# Patient Record
Sex: Male | Born: 1965 | Race: White | Hispanic: No | Marital: Married | Smoking: Current every day smoker
Health system: Southern US, Community
[De-identification: ages and names within clinical notes are randomized; demographics above are authoritative.]

## PROBLEM LIST (undated history)

## (undated) DIAGNOSIS — K859 Acute pancreatitis without necrosis or infection, unspecified: Secondary | ICD-10-CM

## (undated) DIAGNOSIS — Z87442 Personal history of urinary calculi: Secondary | ICD-10-CM

## (undated) DIAGNOSIS — E785 Hyperlipidemia, unspecified: Secondary | ICD-10-CM

## (undated) DIAGNOSIS — E119 Type 2 diabetes mellitus without complications: Secondary | ICD-10-CM

## (undated) DIAGNOSIS — I10 Essential (primary) hypertension: Secondary | ICD-10-CM

## (undated) HISTORY — DX: Type 2 diabetes mellitus without complications: E11.9

## (undated) HISTORY — DX: Hyperlipidemia, unspecified: E78.5

## (undated) HISTORY — PX: GRAFT APPLICATION: SHX6696

## (undated) HISTORY — PX: ROTATOR CUFF REPAIR: SHX139

## (undated) HISTORY — PX: GANGLION CYST EXCISION: SHX1691

---

## 2002-08-19 ENCOUNTER — Emergency Department (HOSPITAL_COMMUNITY): Admission: EM | Admit: 2002-08-19 | Discharge: 2002-08-19 | Payer: Self-pay | Admitting: Emergency Medicine

## 2008-10-01 ENCOUNTER — Encounter: Admission: RE | Admit: 2008-10-01 | Discharge: 2008-10-01 | Payer: Self-pay | Admitting: Family Medicine

## 2008-12-24 ENCOUNTER — Encounter: Admission: RE | Admit: 2008-12-24 | Discharge: 2008-12-24 | Payer: Self-pay | Admitting: Family Medicine

## 2009-02-25 ENCOUNTER — Encounter: Admission: RE | Admit: 2009-02-25 | Discharge: 2009-02-25 | Payer: Self-pay | Admitting: Specialist

## 2010-11-05 ENCOUNTER — Other Ambulatory Visit: Payer: Self-pay | Admitting: Family Medicine

## 2010-11-05 ENCOUNTER — Ambulatory Visit
Admission: RE | Admit: 2010-11-05 | Discharge: 2010-11-05 | Disposition: A | Discharge status: Home / Self Care | Payer: Self-pay | Source: Ambulatory Visit | Attending: Family Medicine | Admitting: Family Medicine

## 2012-08-09 ENCOUNTER — Other Ambulatory Visit: Payer: Self-pay | Admitting: Family Medicine

## 2012-08-09 ENCOUNTER — Ambulatory Visit
Admission: RE | Admit: 2012-08-09 | Discharge: 2012-08-09 | Disposition: A | Discharge status: Home / Self Care | Payer: BC Managed Care – PPO | Source: Ambulatory Visit | Attending: Family Medicine | Admitting: Family Medicine

## 2012-08-09 DIAGNOSIS — M79609 Pain in unspecified limb: Secondary | ICD-10-CM

## 2013-07-17 ENCOUNTER — Encounter: Payer: BC Managed Care – PPO | Attending: Family Medicine | Admitting: Dietician

## 2013-07-17 ENCOUNTER — Encounter: Payer: Self-pay | Admitting: Dietician

## 2013-07-17 VITALS — Ht 70.0 in | Wt 182.3 lb

## 2013-07-17 DIAGNOSIS — Z713 Dietary counseling and surveillance: Secondary | ICD-10-CM | POA: Insufficient documentation

## 2013-07-17 DIAGNOSIS — E119 Type 2 diabetes mellitus without complications: Secondary | ICD-10-CM | POA: Insufficient documentation

## 2013-07-17 NOTE — Progress Notes (Signed)
Patient was seen on 07/17/13 for the first of a series of three diabetes self-management courses at the [facility].  Current HbA1c: 5.5% on 10/23  The following learning objectives were met by the patient during this class:  Describe diabetes  State some common risk factors for diabetes  Defines the role of glucose and insulin  Identifies type of diabetes and pathophysiology  Describe the relationship between diabetes and cardiovascular risk  State the members of the Healthcare Team  States the rationale for glucose monitoring  State when to test glucose  State their individual Target Range  State the importance of logging glucose readings  Describe how to interpret glucose readings  Identifies A1C target  Explain the correlation between A1c and eAG values  State symptoms and treatment of high blood glucose  State symptoms and treatment of low blood glucose  Explain proper technique for glucose testing  Identifies proper sharps disposal  Handouts given during class include:  Living Well with Diabetes book  Carb Counting and Meal Planning book  Meal Plan Card  Carbohydrate guide  Meal planning worksheet  Low Sodium Flavoring Tips  The diabetes portion plate  Low Carbohydrate Snack Suggestions  A1c to eAG Conversion Chart  Diabetes Medications  Stress Management  Diabetes Recommended Care Schedule  Diabetes Success Plan  Core Class Satisfaction Survey  Your patient has identified their diabetes care support plan as:  [facility]  Staff  Follow-Up Plan:  Attend core 2

## 2013-07-17 NOTE — Patient Instructions (Signed)
Goals:  Monitor glucose levels as instructed by your doctor 

## 2013-07-24 ENCOUNTER — Ambulatory Visit: Payer: BC Managed Care – PPO

## 2013-07-31 ENCOUNTER — Ambulatory Visit: Payer: BC Managed Care – PPO

## 2013-08-13 ENCOUNTER — Ambulatory Visit: Payer: BC Managed Care – PPO

## 2013-08-20 ENCOUNTER — Ambulatory Visit: Payer: BC Managed Care – PPO

## 2017-05-12 DIAGNOSIS — Z23 Encounter for immunization: Secondary | ICD-10-CM | POA: Diagnosis not present

## 2017-05-12 DIAGNOSIS — Z0000 Encounter for general adult medical examination without abnormal findings: Secondary | ICD-10-CM | POA: Diagnosis not present

## 2017-05-12 DIAGNOSIS — Z125 Encounter for screening for malignant neoplasm of prostate: Secondary | ICD-10-CM | POA: Diagnosis not present

## 2017-05-12 DIAGNOSIS — R739 Hyperglycemia, unspecified: Secondary | ICD-10-CM | POA: Diagnosis not present

## 2017-05-29 DIAGNOSIS — Z1211 Encounter for screening for malignant neoplasm of colon: Secondary | ICD-10-CM | POA: Diagnosis not present

## 2017-11-20 DIAGNOSIS — M26602 Left temporomandibular joint disorder, unspecified: Secondary | ICD-10-CM | POA: Diagnosis not present

## 2017-11-29 DIAGNOSIS — M26609 Unspecified temporomandibular joint disorder, unspecified side: Secondary | ICD-10-CM | POA: Diagnosis not present

## 2017-12-29 DIAGNOSIS — M25511 Pain in right shoulder: Secondary | ICD-10-CM | POA: Diagnosis not present

## 2018-01-03 DIAGNOSIS — M25511 Pain in right shoulder: Secondary | ICD-10-CM | POA: Diagnosis not present

## 2018-01-24 DIAGNOSIS — M25511 Pain in right shoulder: Secondary | ICD-10-CM | POA: Diagnosis not present

## 2018-02-06 DIAGNOSIS — M25511 Pain in right shoulder: Secondary | ICD-10-CM | POA: Diagnosis not present

## 2018-02-09 DIAGNOSIS — M25511 Pain in right shoulder: Secondary | ICD-10-CM | POA: Diagnosis not present

## 2018-03-01 DIAGNOSIS — M75121 Complete rotator cuff tear or rupture of right shoulder, not specified as traumatic: Secondary | ICD-10-CM | POA: Diagnosis not present

## 2018-03-01 DIAGNOSIS — G8918 Other acute postprocedural pain: Secondary | ICD-10-CM | POA: Diagnosis not present

## 2018-03-01 DIAGNOSIS — M7541 Impingement syndrome of right shoulder: Secondary | ICD-10-CM | POA: Diagnosis not present

## 2018-03-01 DIAGNOSIS — M898X1 Other specified disorders of bone, shoulder: Secondary | ICD-10-CM | POA: Diagnosis not present

## 2018-03-01 DIAGNOSIS — M66821 Spontaneous rupture of other tendons, right upper arm: Secondary | ICD-10-CM | POA: Diagnosis not present

## 2018-03-13 DIAGNOSIS — Z9889 Other specified postprocedural states: Secondary | ICD-10-CM | POA: Diagnosis not present

## 2018-04-03 DIAGNOSIS — Z9889 Other specified postprocedural states: Secondary | ICD-10-CM | POA: Diagnosis not present

## 2018-04-10 DIAGNOSIS — M25511 Pain in right shoulder: Secondary | ICD-10-CM | POA: Diagnosis not present

## 2018-04-10 DIAGNOSIS — M25611 Stiffness of right shoulder, not elsewhere classified: Secondary | ICD-10-CM | POA: Diagnosis not present

## 2018-04-10 DIAGNOSIS — M75101 Unspecified rotator cuff tear or rupture of right shoulder, not specified as traumatic: Secondary | ICD-10-CM | POA: Diagnosis not present

## 2018-04-18 DIAGNOSIS — M25611 Stiffness of right shoulder, not elsewhere classified: Secondary | ICD-10-CM | POA: Diagnosis not present

## 2018-04-18 DIAGNOSIS — M25511 Pain in right shoulder: Secondary | ICD-10-CM | POA: Diagnosis not present

## 2018-04-18 DIAGNOSIS — M75101 Unspecified rotator cuff tear or rupture of right shoulder, not specified as traumatic: Secondary | ICD-10-CM | POA: Diagnosis not present

## 2018-04-24 DIAGNOSIS — M25511 Pain in right shoulder: Secondary | ICD-10-CM | POA: Diagnosis not present

## 2018-04-24 DIAGNOSIS — M75101 Unspecified rotator cuff tear or rupture of right shoulder, not specified as traumatic: Secondary | ICD-10-CM | POA: Diagnosis not present

## 2018-04-24 DIAGNOSIS — M25611 Stiffness of right shoulder, not elsewhere classified: Secondary | ICD-10-CM | POA: Diagnosis not present

## 2018-04-26 DIAGNOSIS — M25511 Pain in right shoulder: Secondary | ICD-10-CM | POA: Diagnosis not present

## 2018-04-26 DIAGNOSIS — M25611 Stiffness of right shoulder, not elsewhere classified: Secondary | ICD-10-CM | POA: Diagnosis not present

## 2018-04-26 DIAGNOSIS — M75101 Unspecified rotator cuff tear or rupture of right shoulder, not specified as traumatic: Secondary | ICD-10-CM | POA: Diagnosis not present

## 2018-04-30 DIAGNOSIS — M25611 Stiffness of right shoulder, not elsewhere classified: Secondary | ICD-10-CM | POA: Diagnosis not present

## 2018-04-30 DIAGNOSIS — M75101 Unspecified rotator cuff tear or rupture of right shoulder, not specified as traumatic: Secondary | ICD-10-CM | POA: Diagnosis not present

## 2018-04-30 DIAGNOSIS — M25511 Pain in right shoulder: Secondary | ICD-10-CM | POA: Diagnosis not present

## 2018-05-02 DIAGNOSIS — Z9889 Other specified postprocedural states: Secondary | ICD-10-CM | POA: Diagnosis not present

## 2018-05-02 DIAGNOSIS — M25611 Stiffness of right shoulder, not elsewhere classified: Secondary | ICD-10-CM | POA: Diagnosis not present

## 2018-05-02 DIAGNOSIS — M25511 Pain in right shoulder: Secondary | ICD-10-CM | POA: Diagnosis not present

## 2018-05-02 DIAGNOSIS — M75101 Unspecified rotator cuff tear or rupture of right shoulder, not specified as traumatic: Secondary | ICD-10-CM | POA: Diagnosis not present

## 2018-05-08 DIAGNOSIS — M25511 Pain in right shoulder: Secondary | ICD-10-CM | POA: Diagnosis not present

## 2018-05-08 DIAGNOSIS — M75101 Unspecified rotator cuff tear or rupture of right shoulder, not specified as traumatic: Secondary | ICD-10-CM | POA: Diagnosis not present

## 2018-05-08 DIAGNOSIS — M25611 Stiffness of right shoulder, not elsewhere classified: Secondary | ICD-10-CM | POA: Diagnosis not present

## 2018-05-10 DIAGNOSIS — M25511 Pain in right shoulder: Secondary | ICD-10-CM | POA: Diagnosis not present

## 2018-05-10 DIAGNOSIS — M25611 Stiffness of right shoulder, not elsewhere classified: Secondary | ICD-10-CM | POA: Diagnosis not present

## 2018-05-10 DIAGNOSIS — M75101 Unspecified rotator cuff tear or rupture of right shoulder, not specified as traumatic: Secondary | ICD-10-CM | POA: Diagnosis not present

## 2018-05-15 DIAGNOSIS — M75101 Unspecified rotator cuff tear or rupture of right shoulder, not specified as traumatic: Secondary | ICD-10-CM | POA: Diagnosis not present

## 2018-05-15 DIAGNOSIS — M25511 Pain in right shoulder: Secondary | ICD-10-CM | POA: Diagnosis not present

## 2018-05-15 DIAGNOSIS — M25611 Stiffness of right shoulder, not elsewhere classified: Secondary | ICD-10-CM | POA: Diagnosis not present

## 2018-05-17 DIAGNOSIS — M75101 Unspecified rotator cuff tear or rupture of right shoulder, not specified as traumatic: Secondary | ICD-10-CM | POA: Diagnosis not present

## 2018-05-17 DIAGNOSIS — M25611 Stiffness of right shoulder, not elsewhere classified: Secondary | ICD-10-CM | POA: Diagnosis not present

## 2018-05-17 DIAGNOSIS — M25511 Pain in right shoulder: Secondary | ICD-10-CM | POA: Diagnosis not present

## 2018-05-21 DIAGNOSIS — M25611 Stiffness of right shoulder, not elsewhere classified: Secondary | ICD-10-CM | POA: Diagnosis not present

## 2018-05-21 DIAGNOSIS — M75101 Unspecified rotator cuff tear or rupture of right shoulder, not specified as traumatic: Secondary | ICD-10-CM | POA: Diagnosis not present

## 2018-05-21 DIAGNOSIS — M25511 Pain in right shoulder: Secondary | ICD-10-CM | POA: Diagnosis not present

## 2018-05-23 DIAGNOSIS — M75101 Unspecified rotator cuff tear or rupture of right shoulder, not specified as traumatic: Secondary | ICD-10-CM | POA: Diagnosis not present

## 2018-05-23 DIAGNOSIS — M25611 Stiffness of right shoulder, not elsewhere classified: Secondary | ICD-10-CM | POA: Diagnosis not present

## 2018-05-23 DIAGNOSIS — M25511 Pain in right shoulder: Secondary | ICD-10-CM | POA: Diagnosis not present

## 2018-05-28 DIAGNOSIS — M25511 Pain in right shoulder: Secondary | ICD-10-CM | POA: Diagnosis not present

## 2018-05-28 DIAGNOSIS — M75101 Unspecified rotator cuff tear or rupture of right shoulder, not specified as traumatic: Secondary | ICD-10-CM | POA: Diagnosis not present

## 2018-05-28 DIAGNOSIS — M25611 Stiffness of right shoulder, not elsewhere classified: Secondary | ICD-10-CM | POA: Diagnosis not present

## 2018-05-30 DIAGNOSIS — M75101 Unspecified rotator cuff tear or rupture of right shoulder, not specified as traumatic: Secondary | ICD-10-CM | POA: Diagnosis not present

## 2018-05-30 DIAGNOSIS — M25611 Stiffness of right shoulder, not elsewhere classified: Secondary | ICD-10-CM | POA: Diagnosis not present

## 2018-05-30 DIAGNOSIS — M25511 Pain in right shoulder: Secondary | ICD-10-CM | POA: Diagnosis not present

## 2018-06-06 DIAGNOSIS — R7303 Prediabetes: Secondary | ICD-10-CM | POA: Diagnosis not present

## 2018-06-06 DIAGNOSIS — I10 Essential (primary) hypertension: Secondary | ICD-10-CM | POA: Diagnosis not present

## 2018-06-06 DIAGNOSIS — E785 Hyperlipidemia, unspecified: Secondary | ICD-10-CM | POA: Diagnosis not present

## 2018-06-06 DIAGNOSIS — Z125 Encounter for screening for malignant neoplasm of prostate: Secondary | ICD-10-CM | POA: Diagnosis not present

## 2018-06-06 DIAGNOSIS — Z23 Encounter for immunization: Secondary | ICD-10-CM | POA: Diagnosis not present

## 2018-06-06 DIAGNOSIS — Z0000 Encounter for general adult medical examination without abnormal findings: Secondary | ICD-10-CM | POA: Diagnosis not present

## 2018-06-07 DIAGNOSIS — M25511 Pain in right shoulder: Secondary | ICD-10-CM | POA: Diagnosis not present

## 2018-06-07 DIAGNOSIS — M25611 Stiffness of right shoulder, not elsewhere classified: Secondary | ICD-10-CM | POA: Diagnosis not present

## 2018-06-07 DIAGNOSIS — M75101 Unspecified rotator cuff tear or rupture of right shoulder, not specified as traumatic: Secondary | ICD-10-CM | POA: Diagnosis not present

## 2018-06-12 DIAGNOSIS — M25611 Stiffness of right shoulder, not elsewhere classified: Secondary | ICD-10-CM | POA: Diagnosis not present

## 2018-06-12 DIAGNOSIS — M75101 Unspecified rotator cuff tear or rupture of right shoulder, not specified as traumatic: Secondary | ICD-10-CM | POA: Diagnosis not present

## 2018-06-12 DIAGNOSIS — M25511 Pain in right shoulder: Secondary | ICD-10-CM | POA: Diagnosis not present

## 2018-06-13 DIAGNOSIS — M25611 Stiffness of right shoulder, not elsewhere classified: Secondary | ICD-10-CM | POA: Diagnosis not present

## 2018-06-13 DIAGNOSIS — M75101 Unspecified rotator cuff tear or rupture of right shoulder, not specified as traumatic: Secondary | ICD-10-CM | POA: Diagnosis not present

## 2018-06-13 DIAGNOSIS — M25511 Pain in right shoulder: Secondary | ICD-10-CM | POA: Diagnosis not present

## 2018-06-19 DIAGNOSIS — M25511 Pain in right shoulder: Secondary | ICD-10-CM | POA: Diagnosis not present

## 2018-06-19 DIAGNOSIS — M75101 Unspecified rotator cuff tear or rupture of right shoulder, not specified as traumatic: Secondary | ICD-10-CM | POA: Diagnosis not present

## 2018-06-19 DIAGNOSIS — M25611 Stiffness of right shoulder, not elsewhere classified: Secondary | ICD-10-CM | POA: Diagnosis not present

## 2018-06-21 DIAGNOSIS — M25611 Stiffness of right shoulder, not elsewhere classified: Secondary | ICD-10-CM | POA: Diagnosis not present

## 2018-06-21 DIAGNOSIS — Z1211 Encounter for screening for malignant neoplasm of colon: Secondary | ICD-10-CM | POA: Diagnosis not present

## 2018-06-21 DIAGNOSIS — M25511 Pain in right shoulder: Secondary | ICD-10-CM | POA: Diagnosis not present

## 2018-06-21 DIAGNOSIS — M75101 Unspecified rotator cuff tear or rupture of right shoulder, not specified as traumatic: Secondary | ICD-10-CM | POA: Diagnosis not present

## 2018-06-25 DIAGNOSIS — M75101 Unspecified rotator cuff tear or rupture of right shoulder, not specified as traumatic: Secondary | ICD-10-CM | POA: Diagnosis not present

## 2018-06-25 DIAGNOSIS — M25511 Pain in right shoulder: Secondary | ICD-10-CM | POA: Diagnosis not present

## 2018-06-25 DIAGNOSIS — M25611 Stiffness of right shoulder, not elsewhere classified: Secondary | ICD-10-CM | POA: Diagnosis not present

## 2018-06-27 DIAGNOSIS — M25611 Stiffness of right shoulder, not elsewhere classified: Secondary | ICD-10-CM | POA: Diagnosis not present

## 2018-06-27 DIAGNOSIS — M25511 Pain in right shoulder: Secondary | ICD-10-CM | POA: Diagnosis not present

## 2018-06-27 DIAGNOSIS — M75101 Unspecified rotator cuff tear or rupture of right shoulder, not specified as traumatic: Secondary | ICD-10-CM | POA: Diagnosis not present

## 2018-07-02 DIAGNOSIS — M25611 Stiffness of right shoulder, not elsewhere classified: Secondary | ICD-10-CM | POA: Diagnosis not present

## 2018-07-02 DIAGNOSIS — M75101 Unspecified rotator cuff tear or rupture of right shoulder, not specified as traumatic: Secondary | ICD-10-CM | POA: Diagnosis not present

## 2018-07-02 DIAGNOSIS — M25511 Pain in right shoulder: Secondary | ICD-10-CM | POA: Diagnosis not present

## 2018-07-04 DIAGNOSIS — M25611 Stiffness of right shoulder, not elsewhere classified: Secondary | ICD-10-CM | POA: Diagnosis not present

## 2018-07-04 DIAGNOSIS — M25511 Pain in right shoulder: Secondary | ICD-10-CM | POA: Diagnosis not present

## 2018-07-04 DIAGNOSIS — M75101 Unspecified rotator cuff tear or rupture of right shoulder, not specified as traumatic: Secondary | ICD-10-CM | POA: Diagnosis not present

## 2018-09-14 ENCOUNTER — Other Ambulatory Visit: Payer: Self-pay | Admitting: Family Medicine

## 2018-09-14 ENCOUNTER — Ambulatory Visit
Admission: RE | Admit: 2018-09-14 | Discharge: 2018-09-14 | Disposition: A | Discharge status: Home / Self Care | Payer: BLUE CROSS/BLUE SHIELD | Source: Ambulatory Visit | Attending: Family Medicine | Admitting: Family Medicine

## 2018-09-14 DIAGNOSIS — R52 Pain, unspecified: Secondary | ICD-10-CM

## 2018-09-14 DIAGNOSIS — R109 Unspecified abdominal pain: Secondary | ICD-10-CM | POA: Diagnosis not present

## 2018-09-14 DIAGNOSIS — N202 Calculus of kidney with calculus of ureter: Secondary | ICD-10-CM | POA: Diagnosis not present

## 2018-09-14 MED ORDER — IOPAMIDOL (ISOVUE-300) INJECTION 61%
100.0000 mL | Freq: Once | INTRAVENOUS | Status: AC | PRN
  Administered 2018-09-14: 100 mL via INTRAVENOUS

## 2018-09-26 DIAGNOSIS — N201 Calculus of ureter: Secondary | ICD-10-CM | POA: Diagnosis not present

## 2018-09-26 DIAGNOSIS — M545 Low back pain: Secondary | ICD-10-CM | POA: Diagnosis not present

## 2018-09-27 ENCOUNTER — Other Ambulatory Visit: Payer: Self-pay | Admitting: Urology

## 2018-09-27 ENCOUNTER — Encounter (HOSPITAL_COMMUNITY): Payer: Self-pay | Admitting: *Deleted

## 2018-09-27 NOTE — Progress Notes (Signed)
Patient instructed to arrive 0930 to admitting on the [facility]. No aspirin or NSAIDS until after the procedure. NPO after midnight. To bring responsible driver to drive him home. Reviewed need for laxative on Sunday prior to procedure. Patient verbalizes understanding.

## 2018-10-01 ENCOUNTER — Ambulatory Visit (HOSPITAL_COMMUNITY): Payer: BLUE CROSS/BLUE SHIELD

## 2018-10-01 ENCOUNTER — Ambulatory Visit (HOSPITAL_COMMUNITY)
Admission: RE | Admit: 2018-10-01 | Discharge: 2018-10-01 | Disposition: A | Discharge status: Home / Self Care | Payer: BLUE CROSS/BLUE SHIELD | Attending: Urology | Admitting: Urology

## 2018-10-01 ENCOUNTER — Other Ambulatory Visit: Payer: Self-pay

## 2018-10-01 ENCOUNTER — Encounter (HOSPITAL_COMMUNITY): Payer: Self-pay | Admitting: *Deleted

## 2018-10-01 ENCOUNTER — Encounter (HOSPITAL_COMMUNITY)
Admission: RE | Disposition: A | Discharge status: Home / Self Care | Payer: Self-pay | Source: Home / Self Care | Attending: Urology

## 2018-10-01 ENCOUNTER — Other Ambulatory Visit: Payer: Self-pay | Admitting: Urology

## 2018-10-01 DIAGNOSIS — E119 Type 2 diabetes mellitus without complications: Secondary | ICD-10-CM | POA: Insufficient documentation

## 2018-10-01 DIAGNOSIS — F17210 Nicotine dependence, cigarettes, uncomplicated: Secondary | ICD-10-CM | POA: Diagnosis not present

## 2018-10-01 DIAGNOSIS — N201 Calculus of ureter: Secondary | ICD-10-CM | POA: Diagnosis not present

## 2018-10-01 DIAGNOSIS — N200 Calculus of kidney: Secondary | ICD-10-CM | POA: Diagnosis not present

## 2018-10-01 DIAGNOSIS — Z87442 Personal history of urinary calculi: Secondary | ICD-10-CM | POA: Insufficient documentation

## 2018-10-01 DIAGNOSIS — Z79899 Other long term (current) drug therapy: Secondary | ICD-10-CM | POA: Insufficient documentation

## 2018-10-01 HISTORY — DX: Personal history of urinary calculi: Z87.442

## 2018-10-01 HISTORY — PX: EXTRACORPOREAL SHOCK WAVE LITHOTRIPSY: SHX1557

## 2018-10-01 LAB — GLUCOSE, CAPILLARY: GLUCOSE-CAPILLARY: 156 mg/dL — AB (ref 70–99)

## 2018-10-01 SURGERY — LITHOTRIPSY, ESWL
Anesthesia: LOCAL | Laterality: Left

## 2018-10-01 MED ORDER — DIAZEPAM 5 MG PO TABS
10.0000 mg | ORAL_TABLET | ORAL | Status: AC
  Administered 2018-10-01: 10 mg via ORAL
  Filled 2018-10-01: qty 2

## 2018-10-01 MED ORDER — OXYCODONE HCL 5 MG PO TABS: 5.0000 mg | ORAL_TABLET | ORAL | Status: DC | PRN

## 2018-10-01 MED ORDER — CIPROFLOXACIN HCL 500 MG PO TABS
500.0000 mg | ORAL_TABLET | ORAL | Status: AC
  Administered 2018-10-01: 500 mg via ORAL
  Filled 2018-10-01: qty 1

## 2018-10-01 MED ORDER — DIPHENHYDRAMINE HCL 25 MG PO CAPS
25.0000 mg | ORAL_CAPSULE | ORAL | Status: AC
  Administered 2018-10-01: 25 mg via ORAL
  Filled 2018-10-01: qty 1

## 2018-10-01 MED ORDER — SODIUM CHLORIDE 0.9 % IV SOLN
INTRAVENOUS | Status: DC
  Administered 2018-10-01: 10:00:00 via INTRAVENOUS

## 2018-10-01 NOTE — H&P (Signed)
I have ureteral stone.  HPI: Charles Wolf is a 53 year-old male patient who is here for ureteral stone.  The problem is on the left side. He first stated noticing pain on 09/14/2018. This is not his first kidney stone. He is currently having back pain. He denies having flank pain, groin pain, nausea, vomiting, fever, and chills. Pain is occuring on the left side. He has not caught a stone in his urine strainer since his symptoms began.   He has never had surgical treatment for calculi in the past.   He underwent a CT scan of the abdomen and pelvis September 14, 2018 which revealed about an 8 mm stone in the left ureterovesical junction. It was visible on the scout image.   He's staying hydrated. No constipation. Pain in front of abd and low back. KUB today confirms an 8 mm left UPJ stone. He hasn't worked since Monday.     ALLERGIES: None   MEDICATIONS: Tamsulosin Hcl 0.4 mg capsule  Ambien  Hydrocodone-Acetaminophen  Zofran     GU PSH: None     PSH Notes: bone graft rt wrist x 2, ganglion cyst rt wrist   NON-GU PSH: Rotator Cuff Surgery.., Right    GU PMH: Renal calculus      PMH Notes:  diabetes diet controlled   NON-GU PMH: Diabetes Type 2    FAMILY HISTORY: Deceased - Father Diabetes - Mother Glaucoma - Mother hyperten - Mother   SOCIAL HISTORY: Marital Status: Married Preferred Language: English; Race: White Current Smoking Status: Patient smokes. Has smoked since 09/22/1988. Smokes 1/2 pack per day.   Tobacco Use Assessment Completed: Used Tobacco in last 30 days? Social Drinker.  Drinks 2 caffeinated drinks per day. 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     Notes: 1 daughter   REVIEW OF SYSTEMS:    GU Review Male:   Patient denies frequent urination, hard to postpone urination, burning/ pain with urination, get up at night to urinate, leakage of urine, stream starts and stops, trouble starting your stream, have to strain to urinate , erection  problems, and penile pain.  Gastrointestinal (Upper):   Patient reports nausea and indigestion/ heartburn. Patient denies vomiting.  Gastrointestinal (Lower):   Patient denies diarrhea and constipation.  Constitutional:   Patient reports night sweats, weight loss, and fatigue. Patient denies fever.  Skin:   Patient denies skin rash/ lesion and itching.  Eyes:   Patient denies double vision and blurred vision.  Ears/ Nose/ Throat:   Patient reports sinus problems. Patient denies sore throat.  Hematologic/Lymphatic:   Patient denies swollen glands and easy bruising.  Cardiovascular:   Patient denies leg swelling and chest pains.  Respiratory:   Patient denies cough and shortness of breath.  Endocrine:   Patient reports excessive thirst.   Musculoskeletal:   Patient reports back pain. Patient denies joint pain.  Neurological:   Patient denies headaches and dizziness.  Psychologic:   Patient denies depression and anxiety.   VITAL SIGNS:      09/26/2018 02:04 PM  Weight 179 lb / 81.19 kg  Height 69 in / 175.26 cm  BP 143/100 mmHg  Pulse 96 /min  Temperature 97.8 F / 36.5 C  BMI 26.4 kg/m   GU PHYSICAL EXAMINATION:    Anus and Perineum: No hemorrhoids. No anal stenosis. No rectal fissure, no anal fissure. No edema, no dimple, no perineal tenderness, no anal tenderness.  Scrotum: No lesions. No edema. No cysts. No warts.  Epididymides: Right:  no spermatocele, no masses, no cysts, no tenderness, no induration, no enlargement. Left: no spermatocele, no masses, no cysts, no tenderness, no induration, no enlargement.  Testes: No tenderness, no swelling, no enlargement left testes. No tenderness, no swelling, no enlargement right testes. Normal location left testes. Normal location right testes. No mass, no cyst, no varicocele, no hydrocele left testes. No mass, no cyst, no varicocele, no hydrocele right testes.  Urethral Meatus: Normal size. No lesion, no wart, no discharge, no polyp. Normal  location.  Penis: Circumcised, no warts, no cracks. No dorsal Peyronie's plaques, no left corporal Peyronie's plaques, no right corporal Peyronie's plaques, no scarring, no warts. No balanitis, no meatal stenosis.  Prostate: 40 gram or 2+ size. Left lobe normal consistency, right lobe normal consistency. Symmetrical lobes. No prostate nodule. Left lobe no tenderness, right lobe no tenderness.  Seminal Vesicles: Nonpalpable.  Sphincter Tone: Normal sphincter. No rectal tenderness. No rectal mass.    MULTI-SYSTEM PHYSICAL EXAMINATION:    Constitutional: Well-nourished. No physical deformities. Normally developed. Good grooming.  Neck: Neck symmetrical, not swollen. Normal tracheal position.  Respiratory: No labored breathing, no use of accessory muscles.   Cardiovascular: Normal temperature, normal extremity pulses, no swelling, no varicosities.  Lymphatic: No enlargement of neck, axillae, groin.  Skin: No paleness, no jaundice, no cyanosis. No lesion, no ulcer, no rash.  Neurologic / Psychiatric: Oriented to time, oriented to place, oriented to person. No depression, no anxiety, no agitation.  Gastrointestinal: No mass, no tenderness, no rigidity, non obese abdomen.  Eyes: Normal conjunctivae. Normal eyelids.  Ears, Nose, Mouth, and Throat: Left ear no scars, no lesions, no masses. Right ear no scars, no lesions, no masses. Nose no scars, no lesions, no masses. Normal hearing. Normal lips.  Musculoskeletal: Normal gait and station of head and neck.     PAST DATA REVIEWED:  Source Of History:  Patient   PROCEDURES:         KUB - [id number]  A single view of the abdomen is obtained.  Calculi:  8 mm left distal stone       The bones appeared normal. The bowel gas pattern appeared normal. The soft tissues were unremarkable.         Urinalysis w/Scope Dipstick Dipstick Cont'd Micro  Color: Amber Bilirubin: Neg mg/dL WBC/hpf: 0 - 5/hpf  Appearance: Clear Ketones: 2+ mg/dL RBC/hpf: 0 - 2/hpf   Specific Gravity: 1.025 Blood: Neg ery/uL Bacteria: NS (Not Seen)  pH: 6.0 Protein: 1+ mg/dL Cystals: NS (Not Seen)  Glucose: Neg mg/dL Urobilinogen: 0.2 mg/dL Casts: Hyaline    Nitrites: Neg Trichomonas: Not Present    Leukocyte Esterase: Neg leu/uL Mucous: Present      Epithelial Cells: NS (Not Seen)      Yeast: NS (Not Seen)      Sperm: Not Present    ASSESSMENT:      ICD-10 Details  1 GU:   Ureteral calculus - N20.1   2   Low back pain - M54.5    PLAN:           Orders X-Rays: KUB          Schedule Return Visit/Planned Activity: Next Available Appointment - Schedule Surgery          Document Letter(s):  Created for Patient: Clinical Summary         Notes:   left distal stone - I discussed with the patient the nature risks and benefits of continued stone passage, off label use of  alpha blockers, shockwave lithotripsy or ureteroscopy. We discussed expected rate of complications and need for staged procedures. All questions answered. He would like to proceed with shockwave lithotripsy. Also discussed one of my colleagues would likely do the case.

## 2018-10-01 NOTE — Discharge Instructions (Signed)
See [facility] discharge instructions in chart.  

## 2018-10-01 NOTE — Op Note (Signed)
See [facility] OP note scanned into chart. Also because of the size, density, location and other factors that cannot be anticipated I feel this will likely be a staged procedure. This fact supersedes any indication in the scanned [facility] operative note to the contrary.  

## 2018-10-02 ENCOUNTER — Encounter (HOSPITAL_COMMUNITY): Payer: Self-pay | Admitting: Urology

## 2018-10-15 DIAGNOSIS — N201 Calculus of ureter: Secondary | ICD-10-CM | POA: Diagnosis not present

## 2018-10-16 DIAGNOSIS — N201 Calculus of ureter: Secondary | ICD-10-CM | POA: Diagnosis not present

## 2019-07-15 DIAGNOSIS — Z0000 Encounter for general adult medical examination without abnormal findings: Secondary | ICD-10-CM | POA: Diagnosis not present

## 2019-07-24 DIAGNOSIS — R1011 Right upper quadrant pain: Secondary | ICD-10-CM | POA: Diagnosis not present

## 2019-07-25 ENCOUNTER — Ambulatory Visit
Admission: RE | Admit: 2019-07-25 | Discharge: 2019-07-25 | Disposition: A | Discharge status: Home / Self Care | Payer: BC Managed Care – PPO | Source: Ambulatory Visit | Attending: Family Medicine | Admitting: Family Medicine

## 2019-07-25 ENCOUNTER — Other Ambulatory Visit: Payer: Self-pay | Admitting: Family Medicine

## 2019-07-25 DIAGNOSIS — K760 Fatty (change of) liver, not elsewhere classified: Secondary | ICD-10-CM | POA: Diagnosis not present

## 2019-07-25 DIAGNOSIS — R1011 Right upper quadrant pain: Secondary | ICD-10-CM

## 2019-07-25 DIAGNOSIS — N200 Calculus of kidney: Secondary | ICD-10-CM | POA: Diagnosis not present

## 2019-07-25 MED ORDER — IOPAMIDOL (ISOVUE-300) INJECTION 61%
100.0000 mL | Freq: Once | INTRAVENOUS | Status: AC | PRN
  Administered 2019-07-25: 100 mL via INTRAVENOUS

## 2019-07-29 DIAGNOSIS — R1011 Right upper quadrant pain: Secondary | ICD-10-CM | POA: Diagnosis not present

## 2019-07-29 DIAGNOSIS — R1084 Generalized abdominal pain: Secondary | ICD-10-CM | POA: Diagnosis not present

## 2019-08-22 DIAGNOSIS — N202 Calculus of kidney with calculus of ureter: Secondary | ICD-10-CM | POA: Diagnosis not present

## 2019-08-27 ENCOUNTER — Other Ambulatory Visit (HOSPITAL_COMMUNITY): Payer: Self-pay | Admitting: Gastroenterology

## 2019-08-27 ENCOUNTER — Other Ambulatory Visit: Payer: Self-pay | Admitting: Gastroenterology

## 2019-08-27 DIAGNOSIS — R1013 Epigastric pain: Secondary | ICD-10-CM | POA: Diagnosis not present

## 2019-08-27 DIAGNOSIS — R634 Abnormal weight loss: Secondary | ICD-10-CM | POA: Diagnosis not present

## 2019-08-30 ENCOUNTER — Ambulatory Visit (HOSPITAL_COMMUNITY)
Admission: RE | Admit: 2019-08-30 | Discharge: 2019-08-30 | Disposition: A | Discharge status: Home / Self Care | Payer: BC Managed Care – PPO | Source: Ambulatory Visit | Attending: Gastroenterology | Admitting: Gastroenterology

## 2019-08-30 ENCOUNTER — Other Ambulatory Visit: Payer: Self-pay

## 2019-08-30 DIAGNOSIS — K760 Fatty (change of) liver, not elsewhere classified: Secondary | ICD-10-CM | POA: Diagnosis not present

## 2019-08-30 DIAGNOSIS — R1013 Epigastric pain: Secondary | ICD-10-CM | POA: Diagnosis not present

## 2019-09-03 ENCOUNTER — Other Ambulatory Visit: Payer: BC Managed Care – PPO

## 2019-09-03 DIAGNOSIS — R1013 Epigastric pain: Secondary | ICD-10-CM | POA: Diagnosis not present

## 2019-09-03 DIAGNOSIS — R634 Abnormal weight loss: Secondary | ICD-10-CM | POA: Diagnosis not present

## 2019-09-09 DIAGNOSIS — Z1159 Encounter for screening for other viral diseases: Secondary | ICD-10-CM | POA: Diagnosis not present

## 2019-09-12 DIAGNOSIS — R748 Abnormal levels of other serum enzymes: Secondary | ICD-10-CM | POA: Diagnosis not present

## 2019-09-12 DIAGNOSIS — K449 Diaphragmatic hernia without obstruction or gangrene: Secondary | ICD-10-CM | POA: Diagnosis not present

## 2019-09-12 DIAGNOSIS — R1011 Right upper quadrant pain: Secondary | ICD-10-CM | POA: Diagnosis not present

## 2019-09-12 DIAGNOSIS — R1013 Epigastric pain: Secondary | ICD-10-CM | POA: Diagnosis not present

## 2019-09-12 DIAGNOSIS — K2970 Gastritis, unspecified, without bleeding: Secondary | ICD-10-CM | POA: Diagnosis not present

## 2019-09-12 DIAGNOSIS — R932 Abnormal findings on diagnostic imaging of liver and biliary tract: Secondary | ICD-10-CM | POA: Diagnosis not present

## 2019-09-25 ENCOUNTER — Other Ambulatory Visit (HOSPITAL_COMMUNITY): Payer: Self-pay | Admitting: Gastroenterology

## 2019-09-25 ENCOUNTER — Other Ambulatory Visit: Payer: Self-pay | Admitting: Gastroenterology

## 2019-09-25 DIAGNOSIS — R7401 Elevation of levels of liver transaminase levels: Secondary | ICD-10-CM | POA: Diagnosis not present

## 2019-09-25 DIAGNOSIS — R1013 Epigastric pain: Secondary | ICD-10-CM

## 2019-09-25 DIAGNOSIS — R634 Abnormal weight loss: Secondary | ICD-10-CM | POA: Diagnosis not present

## 2019-10-07 ENCOUNTER — Other Ambulatory Visit: Payer: Self-pay

## 2019-10-07 ENCOUNTER — Ambulatory Visit (HOSPITAL_COMMUNITY)
Admission: RE | Admit: 2019-10-07 | Discharge: 2019-10-07 | Disposition: A | Discharge status: Home / Self Care | Payer: BC Managed Care – PPO | Source: Ambulatory Visit | Attending: Gastroenterology | Admitting: Gastroenterology

## 2019-10-07 DIAGNOSIS — N202 Calculus of kidney with calculus of ureter: Secondary | ICD-10-CM | POA: Diagnosis not present

## 2019-10-07 DIAGNOSIS — R1013 Epigastric pain: Secondary | ICD-10-CM | POA: Insufficient documentation

## 2019-10-07 MED ORDER — TECHNETIUM TC 99M MEBROFENIN IV KIT
5.3400 | PACK | Freq: Once | INTRAVENOUS | Status: AC | PRN
  Administered 2019-10-07: 5.34 via INTRAVENOUS

## 2019-11-04 DIAGNOSIS — R633 Feeding difficulties: Secondary | ICD-10-CM | POA: Diagnosis not present

## 2019-11-04 DIAGNOSIS — R634 Abnormal weight loss: Secondary | ICD-10-CM | POA: Diagnosis not present

## 2019-11-04 DIAGNOSIS — R7401 Elevation of levels of liver transaminase levels: Secondary | ICD-10-CM | POA: Diagnosis not present

## 2019-11-04 DIAGNOSIS — R1013 Epigastric pain: Secondary | ICD-10-CM | POA: Diagnosis not present

## 2019-12-06 DIAGNOSIS — I10 Essential (primary) hypertension: Secondary | ICD-10-CM | POA: Diagnosis not present

## 2019-12-06 DIAGNOSIS — F5101 Primary insomnia: Secondary | ICD-10-CM | POA: Diagnosis not present

## 2020-01-07 DIAGNOSIS — I10 Essential (primary) hypertension: Secondary | ICD-10-CM | POA: Diagnosis not present

## 2020-01-07 DIAGNOSIS — F5101 Primary insomnia: Secondary | ICD-10-CM | POA: Diagnosis not present

## 2020-04-17 IMAGING — US US ABDOMEN LIMITED
1 series · 14 of 25 positions shown · non-contrast
Comparison: None.

CLINICAL DATA: Right upper quadrant pain

EXAM:
ULTRASOUND ABDOMEN LIMITED RIGHT UPPER QUADRANT

[Series 1: us abdomen limited · 14 of 46 slices shown]
[im 1/46]
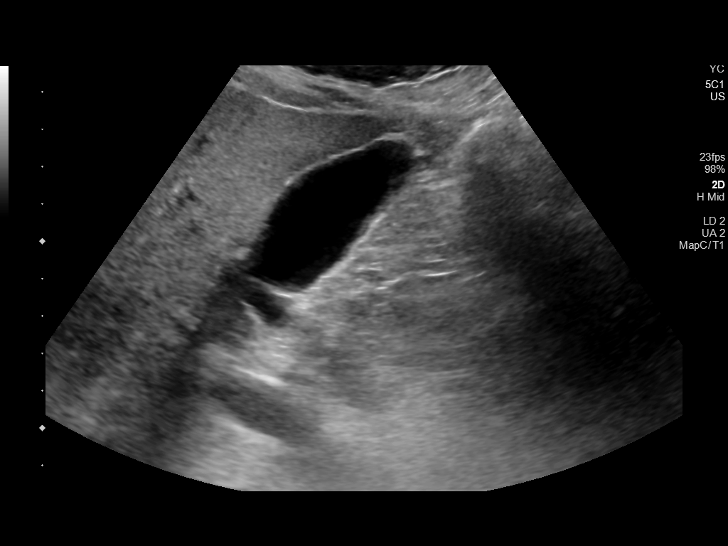
[im 4/46]
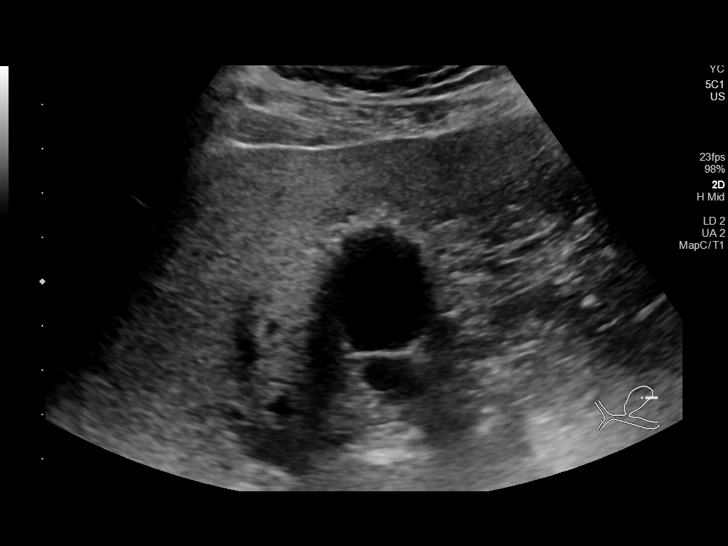
[im 8/46]
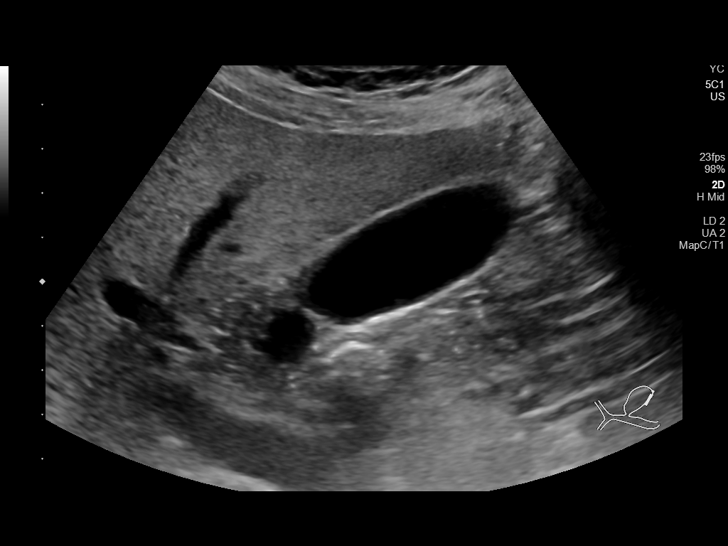
[im 12/46]
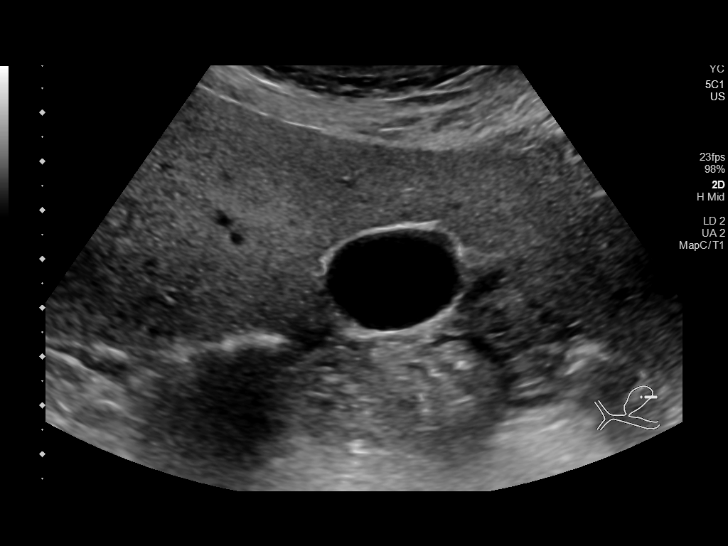
[im 16/46]
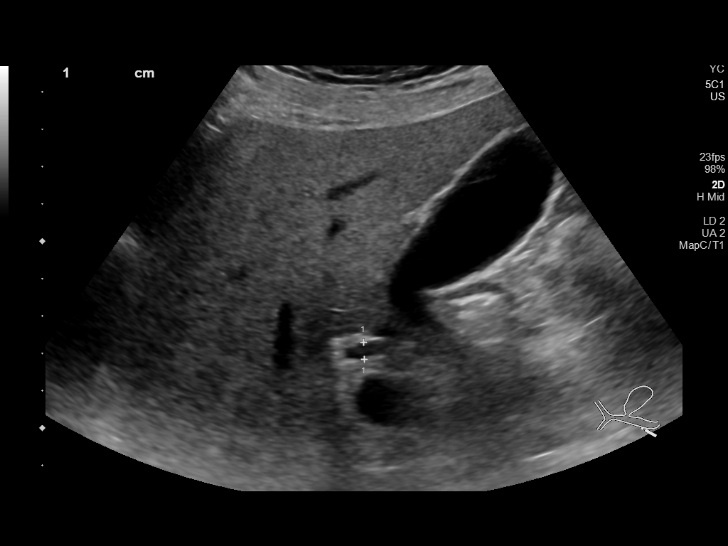
[im 17/46]
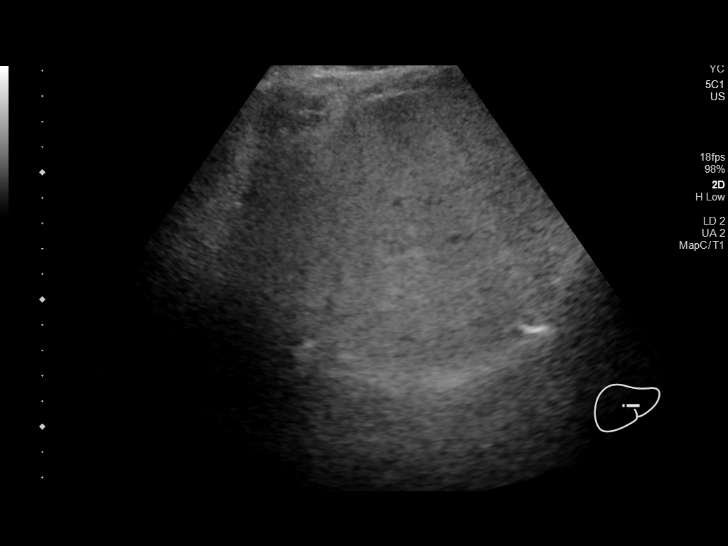
[im 21/46]
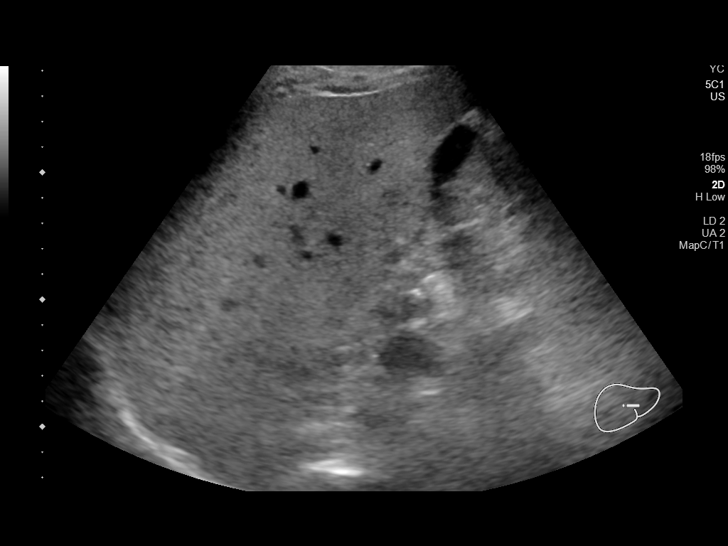
[im 25/46]
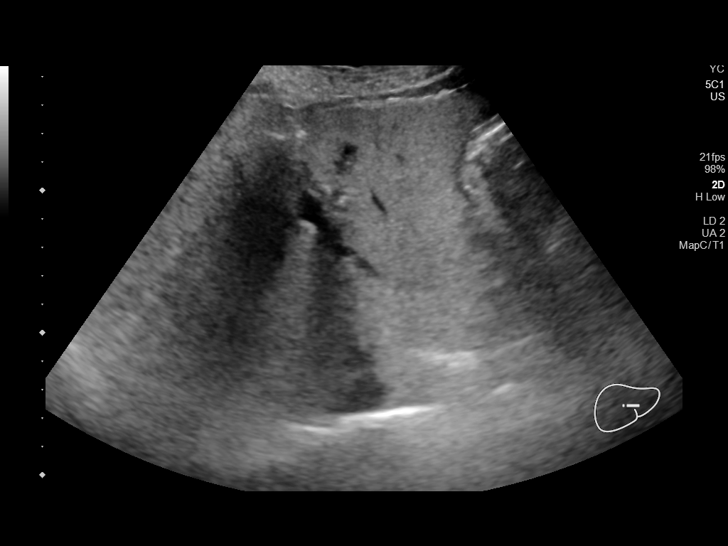
[im 29/46]
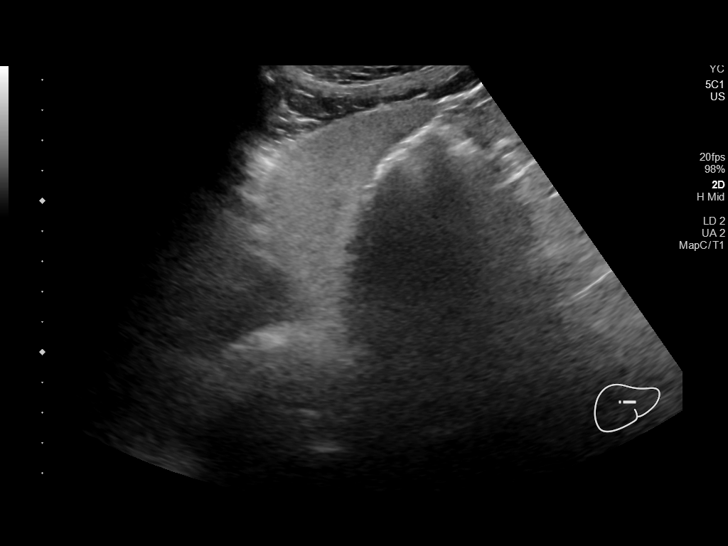
[im 31/46]
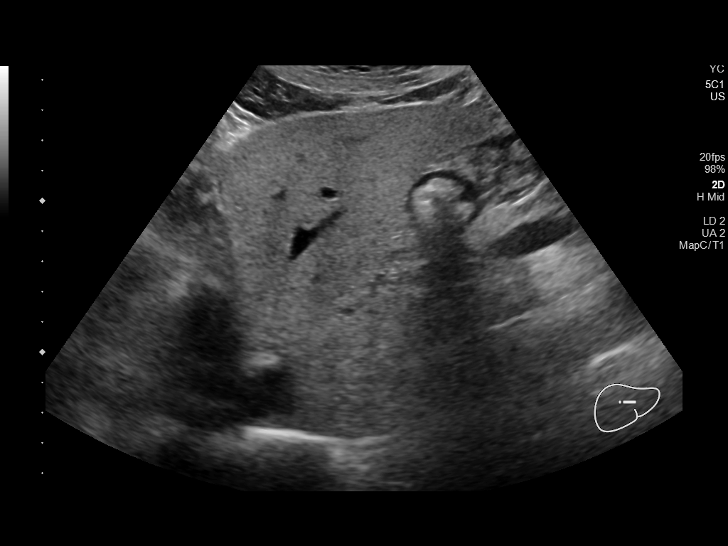
[im 34/46]
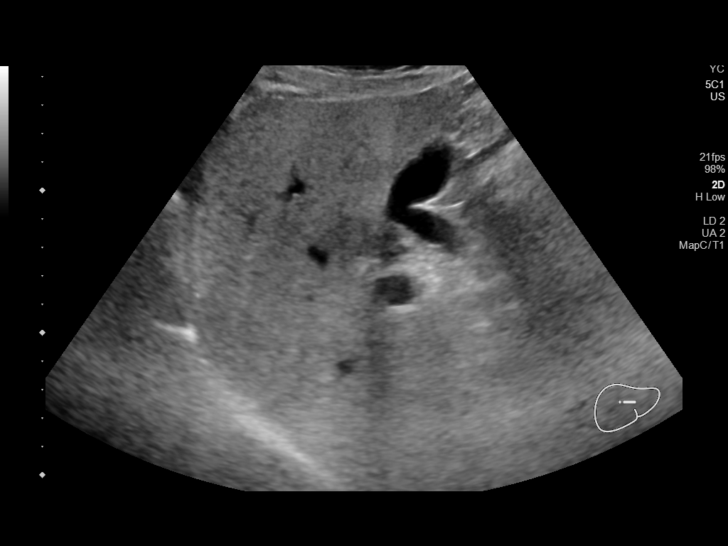
[im 38/46]
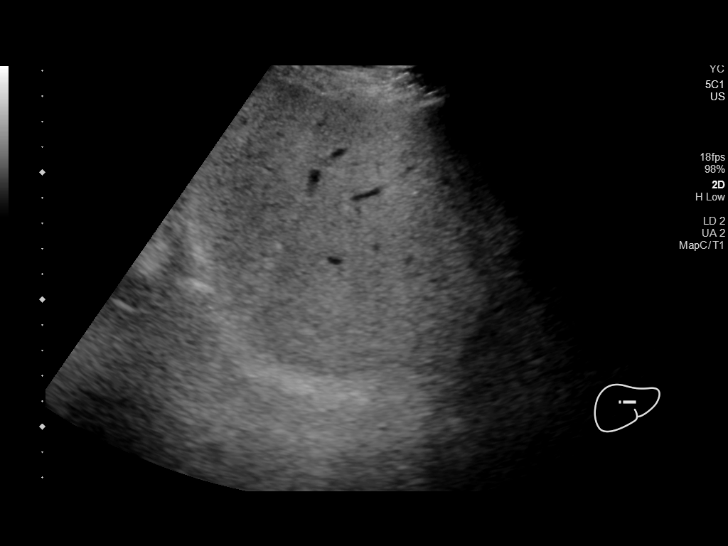
[im 42/46]
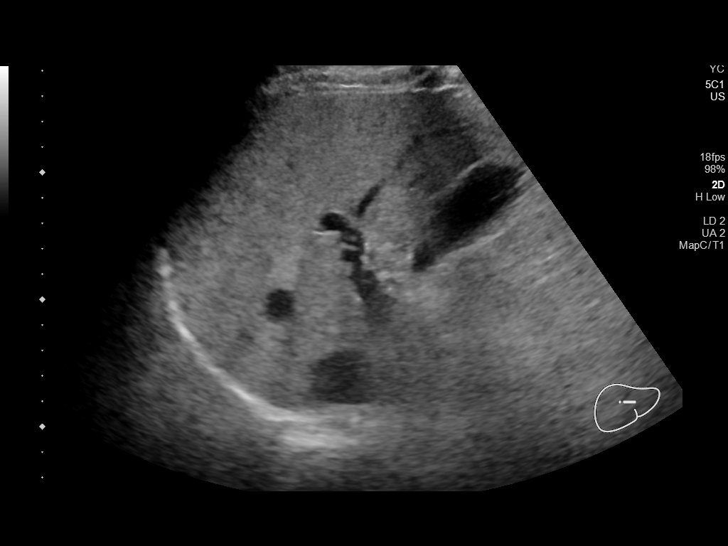
[im 46/46]
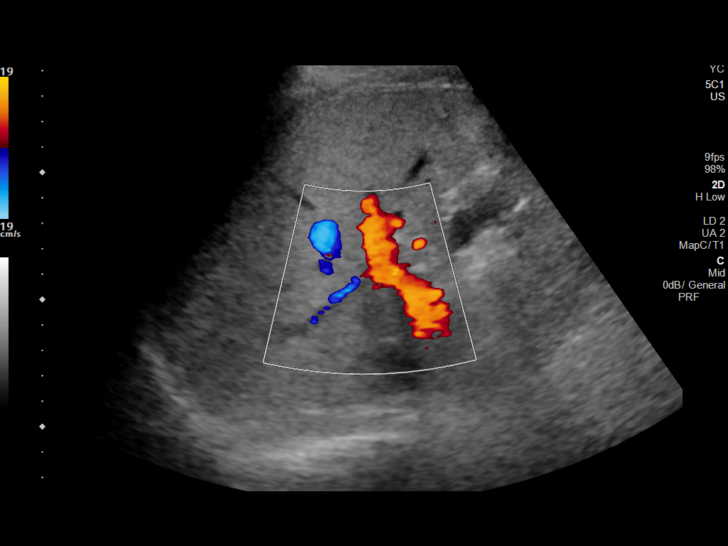

[14 of 25 positions shown; findings below may reference images not displayed]

FINDINGS: Gallbladder:

No gallstones or wall thickening visualized. No sonographic Murphy
sign noted by sonographer.

Common bile duct:

Diameter: Normal caliber, 4-5 mm

Liver:

Increased echotexture compatible with fatty infiltration. No focal
abnormality or biliary ductal dilatation. Portal vein is patent on
color Doppler imaging with normal direction of blood flow towards
the liver.

Other: None.
IMPRESSION: Fatty infiltration of the liver.

No acute findings.

## 2020-05-25 IMAGING — NM NM HEPATO W/GB/PHARM/[PERSON_NAME]
2 series · 12 of 12 positions shown · non-contrast
Comparison: None.

CLINICAL DATA: Epigastric abdominal pain

EXAM:
NUCLEAR MEDICINE HEPATOBILIARY IMAGING WITH GALLBLADDER EF
TECHNIQUE: Sequential images of the abdomen were obtained [DATE] minutes
following intravenous administration of radiopharmaceutical. After
oral ingestion of Ensure, gallbladder ejection fraction was
determined. At 60 min, normal ejection fraction is greater than 33%.
RADIOPHARMACEUTICALS:  5.3 mCi Vc-YYm  Choletec IV

[he hepatobiliary · 4.52mm/px · 6 of 60 frames shown (1 of 2)]
[frame 6/60]
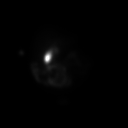
[frame 16/60]
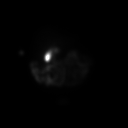
[frame 26/60]
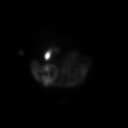
[frame 36/60]
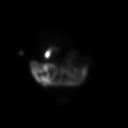
[frame 46/60]
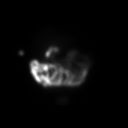
[frame 56/60]
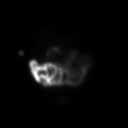

[he hepatobiliary · 4.52mm/px · 6 of 60 frames shown (2 of 2)]
[frame 6/60]
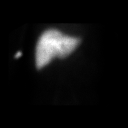
[frame 16/60]
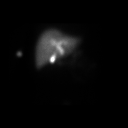
[frame 26/60]
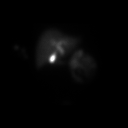
[frame 36/60]
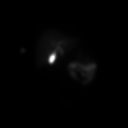
[frame 46/60]
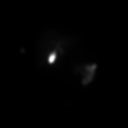
[frame 56/60]
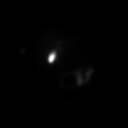

[12 of 12 positions shown; findings below may reference images not displayed]

FINDINGS: Prompt uptake and biliary excretion of activity by the liver is
seen. Gallbladder activity is visualized, consistent with patency of
cystic duct. Biliary activity passes into small bowel, consistent
with patent common bile duct.

Calculated gallbladder ejection fraction is 93%. (Normal gallbladder
ejection fraction with Ensure is greater than 33%.)
IMPRESSION: Normal uptake and excretion of biliary tracer.

Normal gallbladder ejection fraction.

## 2020-11-26 DIAGNOSIS — J0100 Acute maxillary sinusitis, unspecified: Secondary | ICD-10-CM | POA: Diagnosis not present

## 2021-02-03 DIAGNOSIS — R112 Nausea with vomiting, unspecified: Secondary | ICD-10-CM | POA: Diagnosis not present

## 2021-02-03 DIAGNOSIS — R109 Unspecified abdominal pain: Secondary | ICD-10-CM | POA: Diagnosis not present

## 2021-02-03 DIAGNOSIS — R7989 Other specified abnormal findings of blood chemistry: Secondary | ICD-10-CM | POA: Diagnosis not present

## 2021-02-03 DIAGNOSIS — R945 Abnormal results of liver function studies: Secondary | ICD-10-CM | POA: Diagnosis not present

## 2021-02-03 DIAGNOSIS — R197 Diarrhea, unspecified: Secondary | ICD-10-CM | POA: Diagnosis not present

## 2021-02-03 DIAGNOSIS — R7309 Other abnormal glucose: Secondary | ICD-10-CM | POA: Diagnosis not present

## 2021-02-17 DIAGNOSIS — K639 Disease of intestine, unspecified: Secondary | ICD-10-CM | POA: Diagnosis not present

## 2021-02-17 DIAGNOSIS — R140 Abdominal distension (gaseous): Secondary | ICD-10-CM | POA: Diagnosis not present

## 2021-02-17 DIAGNOSIS — Z1211 Encounter for screening for malignant neoplasm of colon: Secondary | ICD-10-CM | POA: Diagnosis not present

## 2021-02-17 DIAGNOSIS — R1084 Generalized abdominal pain: Secondary | ICD-10-CM | POA: Diagnosis not present

## 2021-03-05 DIAGNOSIS — Z1211 Encounter for screening for malignant neoplasm of colon: Secondary | ICD-10-CM | POA: Diagnosis not present

## 2021-03-05 DIAGNOSIS — D122 Benign neoplasm of ascending colon: Secondary | ICD-10-CM | POA: Diagnosis not present

## 2021-03-05 DIAGNOSIS — K5730 Diverticulosis of large intestine without perforation or abscess without bleeding: Secondary | ICD-10-CM | POA: Diagnosis not present

## 2021-04-02 ENCOUNTER — Telehealth: Payer: Self-pay | Admitting: Internal Medicine

## 2021-04-02 NOTE — Telephone Encounter (Signed)
Received a new hem referral from Dr. Hyman Hopes for hemochromatosis. Charles Wolf has been cld and scheduled to see Dr. Arbutus Ped on 8/22 at 2;15pm w/labs at 1:45pm. Pt aware to arrive 15 minutes early.

## 2021-04-12 ENCOUNTER — Other Ambulatory Visit: Payer: Self-pay | Admitting: Internal Medicine

## 2021-04-12 ENCOUNTER — Inpatient Hospital Stay: Payer: BC Managed Care – PPO | Attending: Internal Medicine | Admitting: Internal Medicine

## 2021-04-12 ENCOUNTER — Telehealth: Payer: Self-pay | Admitting: Medical Oncology

## 2021-04-12 ENCOUNTER — Other Ambulatory Visit: Payer: Self-pay

## 2021-04-12 ENCOUNTER — Inpatient Hospital Stay: Payer: BC Managed Care – PPO

## 2021-04-12 ENCOUNTER — Encounter: Payer: Self-pay | Admitting: Internal Medicine

## 2021-04-12 VITALS — BP 144/95 | HR 80 | Temp 98.8°F | Resp 19 | Ht 70.5 in | Wt 168.2 lb

## 2021-04-12 DIAGNOSIS — R109 Unspecified abdominal pain: Secondary | ICD-10-CM | POA: Insufficient documentation

## 2021-04-12 DIAGNOSIS — R1010 Upper abdominal pain, unspecified: Secondary | ICD-10-CM | POA: Diagnosis not present

## 2021-04-12 DIAGNOSIS — Z79899 Other long term (current) drug therapy: Secondary | ICD-10-CM | POA: Insufficient documentation

## 2021-04-12 DIAGNOSIS — D696 Thrombocytopenia, unspecified: Secondary | ICD-10-CM | POA: Diagnosis not present

## 2021-04-12 LAB — CBC WITH DIFFERENTIAL (CANCER CENTER ONLY)
Abs Immature Granulocytes: 0.01 10*3/uL (ref 0.00–0.07)
Basophils Absolute: 0 10*3/uL (ref 0.0–0.1)
Basophils Relative: 1 %
Eosinophils Absolute: 0.1 10*3/uL (ref 0.0–0.5)
Eosinophils Relative: 4 %
HCT: 39.5 % (ref 39.0–52.0)
Hemoglobin: 14.6 g/dL (ref 13.0–17.0)
Immature Granulocytes: 0 %
Lymphocytes Relative: 35 %
Lymphs Abs: 1.2 10*3/uL (ref 0.7–4.0)
MCH: 34.5 pg — ABNORMAL HIGH (ref 26.0–34.0)
MCHC: 37 g/dL — ABNORMAL HIGH (ref 30.0–36.0)
MCV: 93.4 fL (ref 80.0–100.0)
Monocytes Absolute: 0.4 10*3/uL (ref 0.1–1.0)
Monocytes Relative: 11 %
Neutro Abs: 1.6 10*3/uL — ABNORMAL LOW (ref 1.7–7.7)
Neutrophils Relative %: 49 %
Platelet Count: 127 10*3/uL — ABNORMAL LOW (ref 150–400)
RBC: 4.23 MIL/uL (ref 4.22–5.81)
RDW: 11.8 % (ref 11.5–15.5)
WBC Count: 3.4 10*3/uL — ABNORMAL LOW (ref 4.0–10.5)
nRBC: 0 % (ref 0.0–0.2)

## 2021-04-12 LAB — FERRITIN: Ferritin: 3212 ng/mL — ABNORMAL HIGH (ref 24–336)

## 2021-04-12 LAB — CMP (CANCER CENTER ONLY)
ALT: 178 U/L — ABNORMAL HIGH (ref 0–44)
AST: 250 U/L (ref 15–41)
Albumin: 4.8 g/dL (ref 3.5–5.0)
Alkaline Phosphatase: 87 U/L (ref 38–126)
Anion gap: 18 — ABNORMAL HIGH (ref 5–15)
BUN: 8 mg/dL (ref 6–20)
CO2: 21 mmol/L — ABNORMAL LOW (ref 22–32)
Calcium: 9.8 mg/dL (ref 8.9–10.3)
Chloride: 101 mmol/L (ref 98–111)
Creatinine: 1.08 mg/dL (ref 0.61–1.24)
GFR, Estimated: >60 mL/min (ref >60)
Glucose, Bld: 141 mg/dL — ABNORMAL HIGH (ref 70–99)
Potassium: 3.7 mmol/L (ref 3.5–5.1)
Sodium: 140 mmol/L (ref 135–145)
Total Bilirubin: 0.6 mg/dL (ref 0.3–1.2)
Total Protein: 8 g/dL (ref 6.5–8.1)

## 2021-04-12 LAB — IRON AND TIBC
Iron: 114 ug/dL (ref 42–163)
Saturation Ratios: 33 % (ref 20–55)
TIBC: 349 ug/dL (ref 202–409)
UIBC: 235 ug/dL (ref 117–376)

## 2021-04-12 LAB — LACTATE DEHYDROGENASE: LDH: 274 U/L — ABNORMAL HIGH (ref 98–192)

## 2021-04-12 NOTE — Telephone Encounter (Signed)
Pt notified re CT A/P order and elevated liver enzymes and to expect a call from central scheduling and to pick up barium.

## 2021-04-12 NOTE — Progress Notes (Signed)
[facility] [phone]   [phone]  CONSULT NOTE  REFERRING PHYSICIAN: Dr. 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 male with past medical history significant for diabetes mellitus, dyslipidemia, history of kidney stones as well as history of fatty liver.  The patient was seen recently by his primary care physician and has been complaining of abdominal pain for several weeks.  He was referred to gastroenterology and underwent a colonoscopy that was unremarkable except for a colon polyp that was removed.  Unfortunately I do not have any record of this procedure which was done by Dr. Matthias Hughs.  The patient was a started on pancreatic enzymes but no improvement.  He has been complaining of dull abdominal pain and a lot of bloating and gas formation.  During his evaluation he had initial work-up on 02/03/2021 that showed elevated iron saturation.  Repeat blood work on 02/26/2021 to have significant elevation of his ferritin level up to 935.3.  He normal serum iron of 182 and iron saturation of 46% as well as transferrin of 282.  His CBC showed normal hemoglobin of 15.1 and hematocrit 43.4.  His previous blood work on 02/03/2021 showed similar results. His hep C antibody was negative.  The patient also has normal serum lipase and A1c was slightly elevated He was referred to me today for evaluation and recommendation regarding his condition. When seen today he continues to have the abdominal pain as well as occasional nausea and diarrhea with no constipation.  He also has a lot of gas.  He has occasional chest pain and lost around 20 pounds over the last 3-4 weeks.  The patient has no shortness of breath, cough or hemoptysis.  He denied having any fever or chills.  He has no headache or visual changes. Family history significant for mother with heart disease and diabetes mellitus,  father had alcoholic liver failure. Social history the patient is married and has 1 daughter.  He works as a Conservator, museum/gallery.  He was accompanied today by his wife Sherri.  He has a history of smoking 1 pack/day for around 40 years and still smoking.  He drinks alcohol occasionally and no history of drug abuse.  HPI  Past Medical History:  Diagnosis Date   Diabetes mellitus without complication (HCC)    controlled with diet   History of kidney stones    Hyperlipidemia     Past Surgical History:  Procedure Laterality Date   EXTRACORPOREAL SHOCK WAVE LITHOTRIPSY Left 10/01/2018   Procedure: EXTRACORPOREAL SHOCK WAVE LITHOTRIPSY (ESWL);  Surgeon: Crist Fat, MD;  Location: [facility];  Service: Urology;  Laterality: Left;   GANGLION CYST EXCISION     GRAFT APPLICATION     to bone right wrist times 2   ROTATOR CUFF REPAIR Right     Family History  Problem Relation Age of Onset   Hypertension Other    Hyperlipidemia Other    Diabetes Other     Social History Social History   Tobacco Use   Smoking status: Every Day    Packs/day: 0.50    Years: 28.00    Pack years: 14.00    Types: Cigarettes   Smokeless tobacco: Never  Vaping Use   Vaping Use: Never used  Substance Use Topics   Drug use: Never    No Known Allergies  Current Outpatient Medications  Medication Sig  Dispense Refill   atorvastatin (LIPITOR) 10 MG tablet Take 10 mg by mouth daily.     HYDROcodone-acetaminophen (NORCO/VICODIN) 5-325 MG tablet Take 1 tablet by mouth.     omega-3 acid ethyl esters (LOVAZA) 1 G capsule Take by mouth 2 (two) times daily.     omeprazole (PRILOSEC) 40 MG capsule Take 40 mg by mouth as needed.     ondansetron (ZOFRAN) 8 MG tablet Take by mouth every 8 (eight) hours as needed for nausea or vomiting.     tamsulosin (FLOMAX) 0.4 MG CAPS capsule Take 0.4 mg by mouth daily after supper.     No current facility-administered medications for this visit.    Review of  Systems  Constitutional: positive for anorexia, fatigue, and weight loss Eyes: negative Ears, nose, mouth, throat, and face: negative Respiratory: negative Cardiovascular: negative Gastrointestinal: positive for abdominal pain, change in bowel habits, diarrhea, and nausea Genitourinary:negative Integument/breast: negative Hematologic/lymphatic: negative Musculoskeletal:negative Neurological: negative Behavioral/Psych: negative Endocrine: negative Allergic/Immunologic: negative  Physical Exam  WVP:XTGGY, healthy, no distress, well nourished, well developed, and anxious SKIN: skin color, texture, turgor are normal, no rashes or significant lesions HEAD: Normocephalic, No masses, lesions, tenderness or abnormalities EYES: normal, PERRLA, Conjunctiva are pink and non-injected EARS: External ears normal, Canals clear OROPHARYNX:no exudate, no erythema, and lips, buccal mucosa, and tongue normal  NECK: supple, no adenopathy, no JVD LYMPH:  no palpable lymphadenopathy, no hepatosplenomegaly LUNGS: clear to auscultation , and palpation HEART: regular rate & rhythm, no murmurs, and no gallops ABDOMEN:no masses or organomegaly and tender to palpation BACK: Back symmetric, no curvature., No CVA tenderness EXTREMITIES:no joint deformities, effusion, or inflammation, no edema  NEURO: alert & oriented x 3 with fluent speech, no focal motor/sensory deficits  PERFORMANCE STATUS: ECOG 1  LABORATORY DATA: Lab Results  Component Value Date   WBC 3.4 (L) 04/12/2021   HGB 14.6 04/12/2021   HCT 39.5 04/12/2021   MCV 93.4 04/12/2021   PLT 127 (L) 04/12/2021      Chemistry   No results found for: NA, K, CL, CO2, BUN, CREATININE, GLU No results found for: CALCIUM, ALKPHOS, AST, ALT, BILITOT     RADIOGRAPHIC STUDIES: No results found.  ASSESSMENT: This is a very pleasant 55 years old presented for iron metabolism disorder with significant elevation of his serum ferritin but normal serum  iron and iron saturation.  He also has been complaining of abdominal pain that has been going on for the last 2 months.   PLAN: I had a lengthy discussion with the patient and his wife today about his current condition and further investigation to identify the etiology of his condition. Repeat CBC today showed mild leukocytopenia and thrombocytopenia. Comprehensive metabolic panel also showed elevated liver enzyme with AST of 250 and ALT of 178. The patient voices understanding of current disease status and treatment options and is in agreement with the current care plan. Iron studies showed normal serum iron of 114, iron saturation 33%.  Ferritin level is still very elevated at 3212.  I also order hemochromatosis DNA assay to identify any underlying etiology of his significant iron metabolism disorder. For the abdominal pain and elevated liver enzymes, I will order CT of the abdomen and pelvis to rule out any abdominal abnormalities. I will see the patient back for follow-up visit in around 2 weeks or sooner if there is any concerning abnormalities on the pending blood work or the scan. He was advised to call immediately if he has any other concerning  symptoms in the interval. All questions were answered. The patient knows to call the clinic with any problems, questions or concerns. We can certainly see the patient much sooner if necessary.  Thank you so much for allowing me to participate in the care of Charles Wolf. I will continue to follow up the patient with you and assist in his care.  The total time spent in the appointment was 60 minutes.  Disclaimer: This note was dictated with voice recognition software. Similar sounding words can inadvertently be transcribed and may not be corrected upon review.   Lajuana Matte April 12, 2021, 2:05 PM

## 2021-04-16 LAB — HEMOCHROMATOSIS DNA-PCR(C282Y,H63D)

## 2021-04-19 ENCOUNTER — Encounter (HOSPITAL_BASED_OUTPATIENT_CLINIC_OR_DEPARTMENT_OTHER): Payer: Self-pay | Admitting: Radiology

## 2021-04-19 ENCOUNTER — Telehealth: Payer: Self-pay

## 2021-04-19 ENCOUNTER — Other Ambulatory Visit: Payer: Self-pay

## 2021-04-19 ENCOUNTER — Ambulatory Visit (HOSPITAL_BASED_OUTPATIENT_CLINIC_OR_DEPARTMENT_OTHER)
Admission: RE | Admit: 2021-04-19 | Discharge: 2021-04-19 | Disposition: A | Discharge status: Home / Self Care | Payer: BC Managed Care – PPO | Source: Ambulatory Visit | Attending: Internal Medicine | Admitting: Internal Medicine

## 2021-04-19 DIAGNOSIS — R1010 Upper abdominal pain, unspecified: Secondary | ICD-10-CM | POA: Diagnosis not present

## 2021-04-19 DIAGNOSIS — R109 Unspecified abdominal pain: Secondary | ICD-10-CM | POA: Diagnosis not present

## 2021-04-19 DIAGNOSIS — K760 Fatty (change of) liver, not elsewhere classified: Secondary | ICD-10-CM | POA: Diagnosis not present

## 2021-04-19 MED ORDER — IOHEXOL 350 MG/ML SOLN
75.0000 mL | Freq: Once | INTRAVENOUS | Status: AC | PRN
  Administered 2021-04-19: 75 mL via INTRAVENOUS

## 2021-04-19 NOTE — Telephone Encounter (Signed)
I called the patient and left him a brief message regarding this results.  I will discuss with him in more details when I see him on April 29, 2021.  There is no need for any urgent intervention at this point.

## 2021-04-19 NOTE — Telephone Encounter (Signed)
Call report received from [facility] for CT dated 04/19/21, highlighting the following:  IMPRESSION: Colonic diverticulosis and diverticular disease without definitive evidence of acute diverticulitis. Mild thickening more pronounced thickening of the mid to distal sigmoid may simply reflect background diverticular changes. Would however suggest correlation with recent colonoscopy results if available and with follow-up colonoscopy if not recently performed.   Query mild differential enhancement of the LEFT versus the RIGHT kidney, this could relate to scarring. Correlate with any signs UTI.   Nephrolithiasis.   Severe hepatic steatosis, worse than on previous imaging.   Small hiatal hernia.   Aortic atherosclerosis.

## 2021-04-28 ENCOUNTER — Other Ambulatory Visit: Payer: Self-pay | Admitting: Internal Medicine

## 2021-04-28 ENCOUNTER — Other Ambulatory Visit: Payer: Self-pay | Admitting: Physician Assistant

## 2021-04-29 ENCOUNTER — Other Ambulatory Visit: Payer: Self-pay

## 2021-04-29 ENCOUNTER — Inpatient Hospital Stay: Payer: BC Managed Care – PPO | Attending: Internal Medicine | Admitting: Internal Medicine

## 2021-04-29 ENCOUNTER — Inpatient Hospital Stay: Payer: BC Managed Care – PPO

## 2021-04-29 DIAGNOSIS — R5383 Other fatigue: Secondary | ICD-10-CM | POA: Diagnosis not present

## 2021-04-29 DIAGNOSIS — R109 Unspecified abdominal pain: Secondary | ICD-10-CM | POA: Insufficient documentation

## 2021-04-29 DIAGNOSIS — Z79899 Other long term (current) drug therapy: Secondary | ICD-10-CM | POA: Diagnosis not present

## 2021-04-29 DIAGNOSIS — R140 Abdominal distension (gaseous): Secondary | ICD-10-CM | POA: Diagnosis not present

## 2021-04-29 DIAGNOSIS — R197 Diarrhea, unspecified: Secondary | ICD-10-CM | POA: Diagnosis not present

## 2021-04-29 LAB — CBC WITH DIFFERENTIAL (CANCER CENTER ONLY)
Abs Immature Granulocytes: 0.01 10*3/uL (ref 0.00–0.07)
Basophils Absolute: 0 10*3/uL (ref 0.0–0.1)
Basophils Relative: 1 %
Eosinophils Absolute: 0 10*3/uL (ref 0.0–0.5)
Eosinophils Relative: 1 %
HCT: 38.2 % — ABNORMAL LOW (ref 39.0–52.0)
Hemoglobin: 13.7 g/dL (ref 13.0–17.0)
Immature Granulocytes: 0 %
Lymphocytes Relative: 26 %
Lymphs Abs: 0.8 10*3/uL (ref 0.7–4.0)
MCH: 34.5 pg — ABNORMAL HIGH (ref 26.0–34.0)
MCHC: 35.9 g/dL (ref 30.0–36.0)
MCV: 96.2 fL (ref 80.0–100.0)
Monocytes Absolute: 0.4 10*3/uL (ref 0.1–1.0)
Monocytes Relative: 13 %
Neutro Abs: 1.9 10*3/uL (ref 1.7–7.7)
Neutrophils Relative %: 59 %
Platelet Count: 136 10*3/uL — ABNORMAL LOW (ref 150–400)
RBC: 3.97 MIL/uL — ABNORMAL LOW (ref 4.22–5.81)
RDW: 12 % (ref 11.5–15.5)
WBC Count: 3.3 10*3/uL — ABNORMAL LOW (ref 4.0–10.5)
nRBC: 0 % (ref 0.0–0.2)

## 2021-04-29 LAB — FERRITIN: Ferritin: 2875 ng/mL — ABNORMAL HIGH (ref 24–336)

## 2021-04-29 LAB — CMP (CANCER CENTER ONLY)
ALT: 141 U/L — ABNORMAL HIGH (ref 0–44)
AST: 155 U/L — ABNORMAL HIGH (ref 15–41)
Albumin: 4.8 g/dL (ref 3.5–5.0)
Alkaline Phosphatase: 81 U/L (ref 38–126)
Anion gap: 22 — ABNORMAL HIGH (ref 5–15)
BUN: 11 mg/dL (ref 6–20)
CO2: 16 mmol/L — ABNORMAL LOW (ref 22–32)
Calcium: 9.9 mg/dL (ref 8.9–10.3)
Chloride: 99 mmol/L (ref 98–111)
Creatinine: 1.41 mg/dL — ABNORMAL HIGH (ref 0.61–1.24)
GFR, Estimated: 59 mL/min — ABNORMAL LOW (ref >60)
Glucose, Bld: 142 mg/dL — ABNORMAL HIGH (ref 70–99)
Potassium: 3.8 mmol/L (ref 3.5–5.1)
Sodium: 137 mmol/L (ref 135–145)
Total Bilirubin: 2 mg/dL — ABNORMAL HIGH (ref 0.3–1.2)
Total Protein: 8.1 g/dL (ref 6.5–8.1)

## 2021-04-29 NOTE — Progress Notes (Signed)
[facility] Telephone:([phone]   Fax:([phone]  OFFICE PROGRESS NOTE  Shirlean Mylar, MD 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  PRIOR THERAPY: None  CURRENT THERAPY: None  INTERVAL HISTORY: Charles Wolf 55 y.o. male returns to the clinic today for follow-up visit accompanied by his wife.  The patient continues to complain of pain in the abdomen with bloating.  He had 1 episode of diarrhea earlier today.  He denied having any current fever or chills.  He has no nausea, vomiting or constipation.  He denied having any recent weight loss or night sweats.  He has no headache or visual changes.  He has no chest pain, shortness of breath, cough or hemoptysis.  He had several studies performed recently including DNA for hemochromatosis that came back heterozygous for H63D.  The patient also had CT scan of the abdomen pelvis and he is here for evaluation and discussion of his discuss results.  MEDICAL HISTORY: Past Medical History:  Diagnosis Date   Diabetes mellitus without complication (HCC)    controlled with diet   History of kidney stones    Hyperlipidemia     ALLERGIES:  has No Known Allergies.  MEDICATIONS:  Current Outpatient Medications  Medication Sig Dispense Refill   amLODipine (NORVASC) 5 MG tablet Take 5 mg by mouth daily.     omeprazole (PRILOSEC) 20 MG capsule Take 20 mg by mouth daily.     ondansetron (ZOFRAN) 8 MG tablet Take by mouth every 8 (eight) hours as needed for nausea or vomiting.     propranolol (INDERAL) 20 MG tablet Take 20 mg by mouth daily.     traZODone (DESYREL) 50 MG tablet Take 50-100 mg by mouth at bedtime as needed.     No current facility-administered medications for this visit.    SURGICAL HISTORY:  Past Surgical History:  Procedure Laterality Date    EXTRACORPOREAL SHOCK WAVE LITHOTRIPSY Left 10/01/2018   Procedure: EXTRACORPOREAL SHOCK WAVE LITHOTRIPSY (ESWL);  Surgeon: Crist Fat, MD;  Location: [facility];  Service: Urology;  Laterality: Left;   GANGLION CYST EXCISION     GRAFT APPLICATION     to bone right wrist times 2   ROTATOR CUFF REPAIR Right     REVIEW OF SYSTEMS:  Constitutional: positive for fatigue Eyes: negative Ears, nose, mouth, throat, and face: negative Respiratory: negative Cardiovascular: negative Gastrointestinal: positive for abdominal pain and diarrhea Genitourinary:negative Integument/breast: negative Hematologic/lymphatic: negative Musculoskeletal:negative Neurological: negative Behavioral/Psych: negative Endocrine: negative Allergic/Immunologic: negative   PHYSICAL EXAMINATION: General appearance: alert, cooperative, fatigued, and no distress Head: Normocephalic, without obvious abnormality, atraumatic Neck: no adenopathy, no JVD, supple, symmetrical, trachea midline, and thyroid not enlarged, symmetric, no tenderness/mass/nodules Lymph nodes: Cervical, supraclavicular, and axillary nodes normal. Resp: clear to auscultation bilaterally Back: symmetric, no curvature. ROM normal. No CVA tenderness. Cardio: regular rate and rhythm, S1, S2 normal, no murmur, click, rub or gallop GI: soft, non-tender; bowel sounds normal; no masses,  no organomegaly Extremities: extremities normal, atraumatic, no cyanosis or edema Neurologic: Alert and oriented X 3, normal strength and tone. Normal symmetric reflexes. Normal coordination and gait  ECOG PERFORMANCE STATUS: 1 - Symptomatic but completely ambulatory  Blood pressure (!) 143/95, pulse 92, temperature 98.6 F (37 C), temperature source Tympanic, resp. rate 19, height 5' 10.5" (1.791 m), weight 164 lb 3.2 oz (74.5  kg), SpO2 99 %.  LABORATORY DATA: Lab Results  Component Value Date   WBC 3.3 (L) 04/29/2021   HGB 13.7 04/29/2021   HCT 38.2 (L)  04/29/2021   MCV 96.2 04/29/2021   PLT 136 (L) 04/29/2021      Chemistry      Component Value Date/Time   NA 140 04/12/2021 1355   K 3.7 04/12/2021 1355   CL 101 04/12/2021 1355   CO2 21 (L) 04/12/2021 1355   BUN 8 04/12/2021 1355   CREATININE 1.08 04/12/2021 1355      Component Value Date/Time   CALCIUM 9.8 04/12/2021 1355   ALKPHOS 87 04/12/2021 1355   AST 250 (HH) 04/12/2021 1355   ALT 178 (H) 04/12/2021 1355   BILITOT 0.6 04/12/2021 1355       RADIOGRAPHIC STUDIES: CT Abdomen Pelvis W Contrast  Result Date: 04/19/2021 CLINICAL DATA:  Acute abdominal pain in a 55 year old male with occasional nausea and diarrhea, no reported constipation. EXAM: CT ABDOMEN AND PELVIS WITH CONTRAST TECHNIQUE: Multidetector CT imaging of the abdomen and pelvis was performed using the standard protocol following bolus administration of intravenous contrast. CONTRAST:  57mL OMNIPAQUE IOHEXOL 350 MG/ML SOLN COMPARISON:  July 25, 2019. FINDINGS: Lower chest: Lung bases are clear. No effusion. No consolidative changes. Hepatobiliary: No focal, suspicious hepatic lesion. No pericholecystic stranding. No biliary duct dilation. Portal vein is patent. Severe hepatic steatosis, worse than on previous imaging. Pancreas: Normal, without mass, inflammation or ductal dilatation. Spleen: Spleen normal size and contour. Adrenals/Urinary Tract: Adrenal glands are normal. Nephrolithiasis in the upper pole the LEFT kidney. No definite ureteral calculi on contrasted imaging. No hydronephrosis. No substantial perinephric stranding. Mild differential enhancement, LEFT versus RIGHT kidney on corticomedullary phase not substantially different on nephrographic phase. Urinary bladder is collapsed without significant adjacent stranding. Stomach/Bowel: Small hiatal hernia.  No acute small bowel process. Signs of colonic diverticulosis. In the mid sigmoid colon there is focal colonic thickening. Mild pericolonic stranding is  evident and there is diverticular disease The appendix is normal. Vascular/Lymphatic: Patent portal vein. Scattered aortic atherosclerosis. No aneurysmal dilation. Smooth contour of the IVC. There is no gastrohepatic or hepatoduodenal ligament lymphadenopathy. No retroperitoneal or mesenteric lymphadenopathy. No pelvic sidewall lymphadenopathy. Reproductive: Unremarkable by CT. Other: No ascites. No free air. Small fat containing umbilical hernia. Musculoskeletal: No acute bone finding. No destructive bone process. Spinal degenerative changes. IMPRESSION: Colonic diverticulosis and diverticular disease without definitive evidence of acute diverticulitis. Mild thickening more pronounced thickening of the mid to distal sigmoid may simply reflect background diverticular changes. Would however suggest correlation with recent colonoscopy results if available and with follow-up colonoscopy if not recently performed. Query mild differential enhancement of the LEFT versus the RIGHT kidney, this could relate to scarring. Correlate with any signs UTI. Nephrolithiasis. Severe hepatic steatosis, worse than on previous imaging. Small hiatal hernia. Aortic atherosclerosis. Aortic Atherosclerosis (ICD10-I70.0). Electronically Signed   By: Donzetta Kohut M.D.   On: 04/19/2021 15:30    ASSESSMENT AND PLAN: This is a very pleasant 55 years old white male with hyperferritinemia of unclear etiology but likely to be inflammatory in origin.  The patient also is heterozygous for hemochromatosis with abnormal gene in the H63D which would not explain his significant elevation of ferritin level.  His last ferritin level was 3212 but repeat ferritin level today is down to 2875.  This is up from 900 few weeks ago which is not explained by the heterozygous hemochromatosis gene. His comprehensive metabolic panel showed persistent  elevation of the liver enzymes. His recent CT scan of the abdomen pelvis was unremarkable except for colonic  diverticulosis without diverticulitis.  He continues to have intermittent abdominal pain and bloating. I will arrange for the patient to have acute hepatitis panel and he also will need to see his gastroenterologist soon for evaluation. I will also order MRI of the liver to rule out any other inflammatory or iron deposit in the liver. I will arrange for the patient a follow-up appointment with me if needed based on the pending lab results. He was advised to call immediately if he has any other concerning symptoms in the interval. The patient voices understanding of current disease status and treatment options and is in agreement with the current care plan.  All questions were answered. The patient knows to call the clinic with any problems, questions or concerns. We can certainly see the patient much sooner if necessary.  The total time spent in the appointment was 30 minutes.  Disclaimer: This note was dictated with voice recognition software. Similar sounding words can inadvertently be transcribed and may not be corrected upon review.

## 2021-05-04 ENCOUNTER — Other Ambulatory Visit: Payer: Self-pay | Admitting: *Deleted

## 2021-05-04 ENCOUNTER — Telehealth: Payer: Self-pay | Admitting: *Deleted

## 2021-05-04 ENCOUNTER — Other Ambulatory Visit: Payer: Self-pay

## 2021-05-04 ENCOUNTER — Ambulatory Visit (HOSPITAL_COMMUNITY)
Admission: RE | Admit: 2021-05-04 | Discharge: 2021-05-04 | Disposition: A | Discharge status: Home / Self Care | Payer: BC Managed Care – PPO | Source: Ambulatory Visit | Attending: Internal Medicine | Admitting: Internal Medicine

## 2021-05-04 ENCOUNTER — Inpatient Hospital Stay: Payer: BC Managed Care – PPO

## 2021-05-04 DIAGNOSIS — R5383 Other fatigue: Secondary | ICD-10-CM | POA: Diagnosis not present

## 2021-05-04 DIAGNOSIS — R160 Hepatomegaly, not elsewhere classified: Secondary | ICD-10-CM | POA: Diagnosis not present

## 2021-05-04 DIAGNOSIS — Z79899 Other long term (current) drug therapy: Secondary | ICD-10-CM | POA: Diagnosis not present

## 2021-05-04 DIAGNOSIS — R109 Unspecified abdominal pain: Secondary | ICD-10-CM | POA: Diagnosis not present

## 2021-05-04 DIAGNOSIS — K760 Fatty (change of) liver, not elsewhere classified: Secondary | ICD-10-CM | POA: Diagnosis not present

## 2021-05-04 DIAGNOSIS — R140 Abdominal distension (gaseous): Secondary | ICD-10-CM | POA: Diagnosis not present

## 2021-05-04 DIAGNOSIS — R197 Diarrhea, unspecified: Secondary | ICD-10-CM | POA: Diagnosis not present

## 2021-05-04 LAB — CBC WITH DIFFERENTIAL (CANCER CENTER ONLY)
Abs Immature Granulocytes: 0.03 10*3/uL (ref 0.00–0.07)
Basophils Absolute: 0.1 10*3/uL (ref 0.0–0.1)
Basophils Relative: 1 %
Eosinophils Absolute: 0.2 10*3/uL (ref 0.0–0.5)
Eosinophils Relative: 4 %
HCT: 38.3 % — ABNORMAL LOW (ref 39.0–52.0)
Hemoglobin: 14.2 g/dL (ref 13.0–17.0)
Immature Granulocytes: 1 %
Lymphocytes Relative: 22 %
Lymphs Abs: 1.2 10*3/uL (ref 0.7–4.0)
MCH: 34.4 pg — ABNORMAL HIGH (ref 26.0–34.0)
MCHC: 37.1 g/dL — ABNORMAL HIGH (ref 30.0–36.0)
MCV: 92.7 fL (ref 80.0–100.0)
Monocytes Absolute: 0.6 10*3/uL (ref 0.1–1.0)
Monocytes Relative: 10 %
Neutro Abs: 3.6 10*3/uL (ref 1.7–7.7)
Neutrophils Relative %: 62 %
Platelet Count: 172 10*3/uL (ref 150–400)
RBC: 4.13 MIL/uL — ABNORMAL LOW (ref 4.22–5.81)
RDW: 11.9 % (ref 11.5–15.5)
WBC Count: 5.7 10*3/uL (ref 4.0–10.5)
nRBC: 0 % (ref 0.0–0.2)

## 2021-05-04 LAB — HEPATITIS PANEL, ACUTE
HCV Ab: NONREACTIVE
Hep A IgM: NONREACTIVE
Hep B C IgM: NONREACTIVE
Hepatitis B Surface Ag: NONREACTIVE

## 2021-05-04 LAB — CMP (CANCER CENTER ONLY)
ALT: 193 U/L — ABNORMAL HIGH (ref 0–44)
AST: 238 U/L (ref 15–41)
Albumin: 5 g/dL (ref 3.5–5.0)
Alkaline Phosphatase: 87 U/L (ref 38–126)
Anion gap: 21 — ABNORMAL HIGH (ref 5–15)
BUN: 8 mg/dL (ref 6–20)
CO2: 19 mmol/L — ABNORMAL LOW (ref 22–32)
Calcium: 10.1 mg/dL (ref 8.9–10.3)
Chloride: 96 mmol/L — ABNORMAL LOW (ref 98–111)
Creatinine: 1.14 mg/dL (ref 0.61–1.24)
GFR, Estimated: >60 mL/min (ref >60)
Glucose, Bld: 78 mg/dL (ref 70–99)
Potassium: 3.4 mmol/L — ABNORMAL LOW (ref 3.5–5.1)
Sodium: 136 mmol/L (ref 135–145)
Total Bilirubin: 1.1 mg/dL (ref 0.3–1.2)
Total Protein: 8.2 g/dL — ABNORMAL HIGH (ref 6.5–8.1)

## 2021-05-04 LAB — FERRITIN: Ferritin: 2145 ng/mL — ABNORMAL HIGH (ref 24–336)

## 2021-05-04 MED ORDER — GADOBUTROL 1 MMOL/ML IV SOLN
7.0000 mL | Freq: Once | INTRAVENOUS | Status: AC | PRN
  Administered 2021-05-04: 7 mL via INTRAVENOUS

## 2021-05-04 NOTE — Telephone Encounter (Signed)
Per Vonna Kotyk from lab called with critical AST 238. Provider made aware

## 2021-05-10 ENCOUNTER — Telehealth: Payer: Self-pay

## 2021-05-10 NOTE — Telephone Encounter (Signed)
Pt called wanting to review his MRI results as his GI is not available until the end of October.

## 2021-05-24 DIAGNOSIS — R945 Abnormal results of liver function studies: Secondary | ICD-10-CM | POA: Diagnosis not present

## 2021-05-24 DIAGNOSIS — R1084 Generalized abdominal pain: Secondary | ICD-10-CM | POA: Diagnosis not present

## 2021-05-24 DIAGNOSIS — R197 Diarrhea, unspecified: Secondary | ICD-10-CM | POA: Diagnosis not present

## 2021-05-24 DIAGNOSIS — R932 Abnormal findings on diagnostic imaging of liver and biliary tract: Secondary | ICD-10-CM | POA: Diagnosis not present

## 2021-07-05 DIAGNOSIS — R945 Abnormal results of liver function studies: Secondary | ICD-10-CM | POA: Diagnosis not present

## 2021-07-30 DIAGNOSIS — Z23 Encounter for immunization: Secondary | ICD-10-CM | POA: Diagnosis not present

## 2021-07-30 DIAGNOSIS — R109 Unspecified abdominal pain: Secondary | ICD-10-CM | POA: Diagnosis not present

## 2022-02-03 DIAGNOSIS — E1165 Type 2 diabetes mellitus with hyperglycemia: Secondary | ICD-10-CM | POA: Diagnosis not present

## 2022-02-07 ENCOUNTER — Other Ambulatory Visit: Payer: Self-pay | Admitting: Family Medicine

## 2022-02-07 DIAGNOSIS — R1030 Lower abdominal pain, unspecified: Secondary | ICD-10-CM

## 2022-02-07 DIAGNOSIS — K760 Fatty (change of) liver, not elsewhere classified: Secondary | ICD-10-CM

## 2022-02-09 ENCOUNTER — Ambulatory Visit
Admission: RE | Admit: 2022-02-09 | Discharge: 2022-02-09 | Disposition: A | Discharge status: Home / Self Care | Payer: BC Managed Care – PPO | Source: Ambulatory Visit | Attending: Family Medicine | Admitting: Family Medicine

## 2022-02-09 DIAGNOSIS — R197 Diarrhea, unspecified: Secondary | ICD-10-CM | POA: Diagnosis not present

## 2022-02-09 DIAGNOSIS — R1030 Lower abdominal pain, unspecified: Secondary | ICD-10-CM

## 2022-02-09 DIAGNOSIS — K760 Fatty (change of) liver, not elsewhere classified: Secondary | ICD-10-CM | POA: Diagnosis not present

## 2022-02-09 DIAGNOSIS — K449 Diaphragmatic hernia without obstruction or gangrene: Secondary | ICD-10-CM | POA: Diagnosis not present

## 2022-02-09 DIAGNOSIS — N200 Calculus of kidney: Secondary | ICD-10-CM | POA: Diagnosis not present

## 2022-02-09 MED ORDER — IOPAMIDOL (ISOVUE-300) INJECTION 61%
100.0000 mL | Freq: Once | INTRAVENOUS | Status: AC | PRN
  Administered 2022-02-09: 100 mL via INTRAVENOUS

## 2022-03-09 DIAGNOSIS — E785 Hyperlipidemia, unspecified: Secondary | ICD-10-CM | POA: Diagnosis not present

## 2022-03-09 DIAGNOSIS — R140 Abdominal distension (gaseous): Secondary | ICD-10-CM | POA: Diagnosis not present

## 2022-03-09 DIAGNOSIS — I10 Essential (primary) hypertension: Secondary | ICD-10-CM | POA: Diagnosis not present

## 2022-03-09 DIAGNOSIS — E1165 Type 2 diabetes mellitus with hyperglycemia: Secondary | ICD-10-CM | POA: Diagnosis not present

## 2022-05-10 DIAGNOSIS — Z23 Encounter for immunization: Secondary | ICD-10-CM | POA: Diagnosis not present

## 2022-05-10 DIAGNOSIS — E785 Hyperlipidemia, unspecified: Secondary | ICD-10-CM | POA: Diagnosis not present

## 2022-05-10 DIAGNOSIS — I10 Essential (primary) hypertension: Secondary | ICD-10-CM | POA: Diagnosis not present

## 2022-05-10 DIAGNOSIS — E1165 Type 2 diabetes mellitus with hyperglycemia: Secondary | ICD-10-CM | POA: Diagnosis not present

## 2022-05-16 ENCOUNTER — Telehealth: Payer: Self-pay | Admitting: Internal Medicine

## 2022-05-16 NOTE — Telephone Encounter (Signed)
Scheduled appt per 9/25 referral. Pt already established with Dr. Julien Nordmann. Called pt, no answer. Left msg with appt date/time.

## 2022-05-31 ENCOUNTER — Inpatient Hospital Stay: Payer: BC Managed Care – PPO | Attending: Internal Medicine | Admitting: Internal Medicine

## 2022-05-31 ENCOUNTER — Other Ambulatory Visit: Payer: Self-pay

## 2022-05-31 DIAGNOSIS — K760 Fatty (change of) liver, not elsewhere classified: Secondary | ICD-10-CM | POA: Insufficient documentation

## 2022-05-31 DIAGNOSIS — E119 Type 2 diabetes mellitus without complications: Secondary | ICD-10-CM | POA: Insufficient documentation

## 2022-05-31 NOTE — Progress Notes (Signed)
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  PRIOR THERAPY: None  CURRENT THERAPY: None  INTERVAL HISTORY: Charles Wolf 56 y.o. male returns to the clinic today for follow-up visit.  The patient is feeling fine today with no concerning complaints.  He denied having any current fatigue or weakness.  He has no nausea, vomiting, diarrhea or constipation.  He has no chest pain, shortness of breath, cough or hemoptysis.  He denied having any recent weight loss or night sweats.  He had several studies performed recently including repeat CBC, comprehensive metabolic panel, ferritin as well as MRI of the liver and he is here for evaluation and discussion of his lab and imaging studies.  MEDICAL HISTORY: Past Medical History:  Diagnosis Date   Diabetes mellitus without complication ([place])    controlled with diet   History of kidney stones    Hyperlipidemia     ALLERGIES:  has No Known Allergies.  MEDICATIONS:  Current Outpatient Medications  Medication Sig Dispense Refill   amLODipine (NORVASC) 5 MG tablet Take 5 mg by mouth daily.     omeprazole (PRILOSEC) 20 MG capsule Take 20 mg by mouth daily.     ondansetron (ZOFRAN) 8 MG tablet Take by mouth every 8 (eight) hours as needed for nausea or vomiting.     propranolol (INDERAL) 20 MG tablet Take 20 mg by mouth daily.     traZODone (DESYREL) 50 MG tablet Take 50-100 mg by mouth at bedtime as needed.     No current facility-administered medications for this visit.    SURGICAL HISTORY:  Past Surgical History:  Procedure Laterality Date   EXTRACORPOREAL SHOCK WAVE LITHOTRIPSY Left 10/01/2018   Procedure: EXTRACORPOREAL SHOCK WAVE LITHOTRIPSY (ESWL);  Surgeon:  Ardis Hughs, MD;  Location: [facility];  Service: Urology;  Laterality: Left;   GANGLION CYST EXCISION     GRAFT APPLICATION     to bone right wrist times 2   ROTATOR CUFF REPAIR Right     REVIEW OF SYSTEMS:  A comprehensive review of systems was negative.   PHYSICAL EXAMINATION: General appearance: alert, cooperative, and no distress Head: Normocephalic, without obvious abnormality, atraumatic Neck: no adenopathy, no JVD, supple, symmetrical, trachea midline, and thyroid not enlarged, symmetric, no tenderness/mass/nodules Lymph nodes: Cervical, supraclavicular, and axillary nodes normal. Resp: clear to auscultation bilaterally Back: symmetric, no curvature. ROM normal. No CVA tenderness. Cardio: regular rate and rhythm, S1, S2 normal, no murmur, click, rub or gallop GI: soft, non-tender; bowel sounds normal; no masses,  no organomegaly Extremities: extremities normal, atraumatic, no cyanosis or edema  ECOG PERFORMANCE STATUS: 1 - Symptomatic but completely ambulatory  Blood pressure (!) 142/80, pulse 95, temperature 98.6 F (37 C), temperature source Oral, resp. rate 15, weight 178 lb 3.2 oz (80.8 kg), SpO2 98 %.  LABORATORY DATA: Lab Results  Component Value Date   WBC 5.7 05/04/2021   HGB 14.2 05/04/2021   HCT 38.3 (L) 05/04/2021   MCV 92.7 05/04/2021   PLT 172 05/04/2021      Chemistry      Component Value Date/Time   NA 136 05/04/2021 1110   K 3.4 (L) 05/04/2021 1110   CL 96 (L) 05/04/2021 1110   CO2 19 (L) 05/04/2021 1110  BUN 8 05/04/2021 1110   CREATININE 1.14 05/04/2021 1110      Component Value Date/Time   CALCIUM 10.1 05/04/2021 1110   ALKPHOS 87 05/04/2021 1110   AST 238 (HH) 05/04/2021 1110   ALT 193 (H) 05/04/2021 1110   BILITOT 1.1 05/04/2021 1110       RADIOGRAPHIC STUDIES: No results found.  ASSESSMENT AND PLAN: This is a very pleasant 56 years old white male with hyperferritinemia of unclear etiology but likely to be inflammatory in  origin.  The patient also is heterozygous for hemochromatosis with abnormal gene in the H63D which would not explain his significant elevation of ferritin level.  His last ferritin level was 3212 but repeat ferritin level today is down to 2875.  This is up from 900 few weeks ago which is not explained by the heterozygous hemochromatosis gene. The patient is feeling a little bit better today. Repeat ferritin level on 05/04/2022 showed further decrease in the ferritin level down to 2145.  Comprehensive metabolic panel continues to show the elevated liver enzymes and MRI of the liver showed hepatic steatosis but no clear iron deposition. I discussed the lab and imaging studies with the patient. I recommended for him to see gastroenterology for further evaluation of his condition.  He was seen by Dr. Cristina Gong in the past.  I will refer him to [facility] gastroenterology for further evaluation and recommendation regarding his liver dysfunction. I will see the patient back for follow-up visit in 3 months for evaluation and repeat CBC, iron study and ferritin. The patient was advised to call immediately if he has any other concerning symptoms in the interval. The patient voices understanding of current disease status and treatment options and is in agreement with the current care plan.  All questions were answered. The patient knows to call the clinic with any problems, questions or concerns. We can certainly see the patient much sooner if necessary.  The total time spent in the appointment was 20 minutes.  Disclaimer: This note was dictated with voice recognition software. Similar sounding words can inadvertently be transcribed and may not be corrected upon review.

## 2022-06-13 DIAGNOSIS — R7401 Elevation of levels of liver transaminase levels: Secondary | ICD-10-CM | POA: Diagnosis not present

## 2022-06-13 DIAGNOSIS — E785 Hyperlipidemia, unspecified: Secondary | ICD-10-CM | POA: Diagnosis not present

## 2022-08-09 DIAGNOSIS — Z125 Encounter for screening for malignant neoplasm of prostate: Secondary | ICD-10-CM | POA: Diagnosis not present

## 2022-08-09 DIAGNOSIS — Z0000 Encounter for general adult medical examination without abnormal findings: Secondary | ICD-10-CM | POA: Diagnosis not present

## 2022-08-09 DIAGNOSIS — E538 Deficiency of other specified B group vitamins: Secondary | ICD-10-CM | POA: Diagnosis not present

## 2022-08-09 DIAGNOSIS — R252 Cramp and spasm: Secondary | ICD-10-CM | POA: Diagnosis not present

## 2022-08-30 ENCOUNTER — Ambulatory Visit: Payer: BC Managed Care – PPO | Admitting: Internal Medicine

## 2022-08-30 ENCOUNTER — Other Ambulatory Visit: Payer: BC Managed Care – PPO

## 2023-06-14 DIAGNOSIS — R1013 Epigastric pain: Secondary | ICD-10-CM | POA: Diagnosis not present

## 2023-06-14 DIAGNOSIS — E1165 Type 2 diabetes mellitus with hyperglycemia: Secondary | ICD-10-CM | POA: Diagnosis not present

## 2023-06-14 DIAGNOSIS — R634 Abnormal weight loss: Secondary | ICD-10-CM | POA: Diagnosis not present

## 2023-06-14 DIAGNOSIS — R112 Nausea with vomiting, unspecified: Secondary | ICD-10-CM | POA: Diagnosis not present

## 2023-06-14 DIAGNOSIS — R109 Unspecified abdominal pain: Secondary | ICD-10-CM | POA: Diagnosis not present

## 2023-06-14 DIAGNOSIS — I10 Essential (primary) hypertension: Secondary | ICD-10-CM | POA: Diagnosis not present

## 2023-06-21 DIAGNOSIS — E871 Hypo-osmolality and hyponatremia: Secondary | ICD-10-CM | POA: Diagnosis not present

## 2023-06-26 DIAGNOSIS — E871 Hypo-osmolality and hyponatremia: Secondary | ICD-10-CM | POA: Diagnosis not present

## 2023-06-26 DIAGNOSIS — E1165 Type 2 diabetes mellitus with hyperglycemia: Secondary | ICD-10-CM | POA: Diagnosis not present

## 2023-07-10 DIAGNOSIS — E871 Hypo-osmolality and hyponatremia: Secondary | ICD-10-CM | POA: Diagnosis not present

## 2023-08-25 ENCOUNTER — Other Ambulatory Visit: Payer: Self-pay | Admitting: Family Medicine

## 2023-08-25 DIAGNOSIS — R55 Syncope and collapse: Secondary | ICD-10-CM

## 2023-08-25 DIAGNOSIS — Z125 Encounter for screening for malignant neoplasm of prostate: Secondary | ICD-10-CM | POA: Diagnosis not present

## 2023-08-25 DIAGNOSIS — I10 Essential (primary) hypertension: Secondary | ICD-10-CM | POA: Diagnosis not present

## 2023-08-25 DIAGNOSIS — I700 Atherosclerosis of aorta: Secondary | ICD-10-CM | POA: Diagnosis not present

## 2023-08-25 DIAGNOSIS — E1165 Type 2 diabetes mellitus with hyperglycemia: Secondary | ICD-10-CM | POA: Diagnosis not present

## 2023-08-25 DIAGNOSIS — R42 Dizziness and giddiness: Secondary | ICD-10-CM

## 2023-08-25 DIAGNOSIS — H539 Unspecified visual disturbance: Secondary | ICD-10-CM

## 2023-08-25 DIAGNOSIS — E785 Hyperlipidemia, unspecified: Secondary | ICD-10-CM | POA: Diagnosis not present

## 2023-08-25 DIAGNOSIS — F1020 Alcohol dependence, uncomplicated: Secondary | ICD-10-CM | POA: Diagnosis not present

## 2023-08-25 DIAGNOSIS — Z0000 Encounter for general adult medical examination without abnormal findings: Secondary | ICD-10-CM | POA: Diagnosis not present

## 2023-08-28 ENCOUNTER — Other Ambulatory Visit: Payer: Self-pay

## 2023-08-30 ENCOUNTER — Ambulatory Visit
Admission: RE | Admit: 2023-08-30 | Discharge: 2023-08-30 | Disposition: A | Discharge status: Home / Self Care | Payer: BC Managed Care – PPO | Source: Ambulatory Visit | Attending: Family Medicine | Admitting: Family Medicine

## 2023-08-30 DIAGNOSIS — R55 Syncope and collapse: Secondary | ICD-10-CM

## 2023-08-30 DIAGNOSIS — H539 Unspecified visual disturbance: Secondary | ICD-10-CM | POA: Diagnosis not present

## 2023-08-30 DIAGNOSIS — R42 Dizziness and giddiness: Secondary | ICD-10-CM | POA: Diagnosis not present

## 2023-08-30 MED ORDER — IOPAMIDOL (ISOVUE-300) INJECTION 61%
75.0000 mL | Freq: Once | INTRAVENOUS | Status: AC | PRN
  Administered 2023-08-30: 75 mL via INTRAVENOUS

## 2023-09-08 DIAGNOSIS — E871 Hypo-osmolality and hyponatremia: Secondary | ICD-10-CM | POA: Diagnosis not present

## 2023-09-08 DIAGNOSIS — I10 Essential (primary) hypertension: Secondary | ICD-10-CM | POA: Diagnosis not present

## 2023-09-08 DIAGNOSIS — N289 Disorder of kidney and ureter, unspecified: Secondary | ICD-10-CM | POA: Diagnosis not present

## 2023-09-08 DIAGNOSIS — Z720 Tobacco use: Secondary | ICD-10-CM | POA: Diagnosis not present

## 2023-11-13 DIAGNOSIS — R42 Dizziness and giddiness: Secondary | ICD-10-CM | POA: Diagnosis not present

## 2023-11-13 DIAGNOSIS — R519 Headache, unspecified: Secondary | ICD-10-CM | POA: Diagnosis not present

## 2023-11-13 DIAGNOSIS — R053 Chronic cough: Secondary | ICD-10-CM | POA: Diagnosis not present

## 2023-11-13 DIAGNOSIS — J321 Chronic frontal sinusitis: Secondary | ICD-10-CM | POA: Diagnosis not present

## 2023-11-15 DIAGNOSIS — R053 Chronic cough: Secondary | ICD-10-CM | POA: Diagnosis not present

## 2023-11-28 DIAGNOSIS — J3489 Other specified disorders of nose and nasal sinuses: Secondary | ICD-10-CM | POA: Diagnosis not present

## 2023-11-28 DIAGNOSIS — R519 Headache, unspecified: Secondary | ICD-10-CM | POA: Diagnosis not present

## 2023-11-28 DIAGNOSIS — J449 Chronic obstructive pulmonary disease, unspecified: Secondary | ICD-10-CM | POA: Diagnosis not present

## 2023-11-28 DIAGNOSIS — Z720 Tobacco use: Secondary | ICD-10-CM | POA: Diagnosis not present

## 2023-11-30 ENCOUNTER — Telehealth: Payer: Self-pay | Admitting: *Deleted

## 2023-11-30 DIAGNOSIS — Z87891 Personal history of nicotine dependence: Secondary | ICD-10-CM

## 2023-11-30 DIAGNOSIS — R519 Headache, unspecified: Secondary | ICD-10-CM | POA: Diagnosis not present

## 2023-11-30 DIAGNOSIS — J323 Chronic sphenoidal sinusitis: Secondary | ICD-10-CM | POA: Diagnosis not present

## 2023-11-30 DIAGNOSIS — G8929 Other chronic pain: Secondary | ICD-10-CM | POA: Diagnosis not present

## 2023-11-30 DIAGNOSIS — Z122 Encounter for screening for malignant neoplasm of respiratory organs: Secondary | ICD-10-CM

## 2023-11-30 DIAGNOSIS — F17210 Nicotine dependence, cigarettes, uncomplicated: Secondary | ICD-10-CM

## 2023-11-30 NOTE — Telephone Encounter (Signed)
 Lung Cancer Screening Narrative/Criteria Questionnaire (Cigarette Smokers Only- No Cigars/Pipes/vapes)   Charles Wolf   SDMV:12/22/23 12:30- [name]                                           [birth year]              LDCT: 12/22/23 4:00- GI    58 y.o.   Phone: [phone]  Lung Screening Narrative (confirm age 67-77 yrs Medicare / 50-80 yrs Private pay insurance)   Insurance information:BCBS   Referring Provider:Webb   This screening involves an initial phone call with a team member from our program. It is called a shared decision making visit. The initial meeting is required by insurance and Medicare to make sure you understand the program. This appointment takes about 15-20 minutes to complete. The CT scan will completed at a separate date/time. This scan takes about 5-10 minutes to complete and you may eat and drink before and after the scan.  Criteria questions for Lung Cancer Screening:   Are you a current or former smoker? Current Age began smoking: 15   If you are a former smoker, what year did you quit smoking? (within 15 yrs)   To calculate your smoking history, I need an accurate estimate of how many packs of cigarettes you smoked per day and for how many years. (Not just the number of PPD you are now smoking)   Years smoking 42 x Packs per day 1 = Pack years 42   (at least 20 pack yrs)   (Make sure they understand that we need to know how much they have smoked in the past, not just the number of PPD they are smoking now)  Do you have a personal history of cancer?  No    Do you have a family history of cancer? No  Are you coughing up blood?  No  Have you had unexplained weight loss of 15 lbs or more in the last 6 months? No  It looks like you meet all criteria.     Additional information: N/A

## 2023-11-30 NOTE — Telephone Encounter (Signed)
 Copied from CRM [id number]. Topic: Appointments - Scheduling Inquiry for Clinic >> Nov 30, 2023  2:44 PM Theodis Sato wrote: Reason for CRM: Please call patient back to schedule lung cancer screening.  Spoke with patient Scheduled SDMV 12/22/23 12:30 CT scheduled 12/22/23 4:00 Pt voiced understanding and had no further questions.

## 2023-12-12 ENCOUNTER — Emergency Department (HOSPITAL_COMMUNITY)

## 2023-12-12 ENCOUNTER — Emergency Department (HOSPITAL_COMMUNITY)
Admission: EM | Admit: 2023-12-12 | Discharge: 2023-12-12 | Disposition: A | Discharge status: Home / Self Care | Attending: Emergency Medicine | Admitting: Emergency Medicine

## 2023-12-12 ENCOUNTER — Other Ambulatory Visit: Payer: Self-pay

## 2023-12-12 DIAGNOSIS — I517 Cardiomegaly: Secondary | ICD-10-CM | POA: Diagnosis not present

## 2023-12-12 DIAGNOSIS — R072 Precordial pain: Secondary | ICD-10-CM | POA: Insufficient documentation

## 2023-12-12 DIAGNOSIS — I10 Essential (primary) hypertension: Secondary | ICD-10-CM | POA: Diagnosis not present

## 2023-12-12 DIAGNOSIS — Z7984 Long term (current) use of oral hypoglycemic drugs: Secondary | ICD-10-CM | POA: Insufficient documentation

## 2023-12-12 DIAGNOSIS — G8929 Other chronic pain: Secondary | ICD-10-CM

## 2023-12-12 DIAGNOSIS — R519 Headache, unspecified: Secondary | ICD-10-CM | POA: Diagnosis not present

## 2023-12-12 DIAGNOSIS — I6523 Occlusion and stenosis of bilateral carotid arteries: Secondary | ICD-10-CM | POA: Insufficient documentation

## 2023-12-12 DIAGNOSIS — E041 Nontoxic single thyroid nodule: Secondary | ICD-10-CM | POA: Diagnosis not present

## 2023-12-12 DIAGNOSIS — E042 Nontoxic multinodular goiter: Secondary | ICD-10-CM | POA: Diagnosis not present

## 2023-12-12 DIAGNOSIS — R030 Elevated blood-pressure reading, without diagnosis of hypertension: Secondary | ICD-10-CM

## 2023-12-12 DIAGNOSIS — E119 Type 2 diabetes mellitus without complications: Secondary | ICD-10-CM | POA: Insufficient documentation

## 2023-12-12 DIAGNOSIS — R0789 Other chest pain: Secondary | ICD-10-CM | POA: Diagnosis not present

## 2023-12-12 DIAGNOSIS — I672 Cerebral atherosclerosis: Secondary | ICD-10-CM | POA: Diagnosis not present

## 2023-12-12 DIAGNOSIS — Z79899 Other long term (current) drug therapy: Secondary | ICD-10-CM | POA: Diagnosis not present

## 2023-12-12 DIAGNOSIS — I6529 Occlusion and stenosis of unspecified carotid artery: Secondary | ICD-10-CM | POA: Diagnosis not present

## 2023-12-12 LAB — CBC WITH DIFFERENTIAL/PLATELET
Abs Immature Granulocytes: 0.01 10*3/uL (ref 0.00–0.07)
Basophils Absolute: 0.1 10*3/uL (ref 0.0–0.1)
Basophils Relative: 1 %
Eosinophils Absolute: 0.2 10*3/uL (ref 0.0–0.5)
Eosinophils Relative: 5 %
HCT: 43.1 % (ref 39.0–52.0)
Hemoglobin: 14.9 g/dL (ref 13.0–17.0)
Immature Granulocytes: 0 %
Lymphocytes Relative: 26 %
Lymphs Abs: 1.2 10*3/uL (ref 0.7–4.0)
MCH: 33 pg (ref 26.0–34.0)
MCHC: 34.6 g/dL (ref 30.0–36.0)
MCV: 95.6 fL (ref 80.0–100.0)
Monocytes Absolute: 0.5 10*3/uL (ref 0.1–1.0)
Monocytes Relative: 12 %
Neutro Abs: 2.5 10*3/uL (ref 1.7–7.7)
Neutrophils Relative %: 56 %
Platelets: 208 10*3/uL (ref 150–400)
RBC: 4.51 MIL/uL (ref 4.22–5.81)
RDW: 11.8 % (ref 11.5–15.5)
WBC: 4.4 10*3/uL (ref 4.0–10.5)
nRBC: 0 % (ref 0.0–0.2)

## 2023-12-12 LAB — COMPREHENSIVE METABOLIC PANEL WITH GFR
ALT: 32 U/L (ref 0–44)
AST: 36 U/L (ref 15–41)
Albumin: 4.4 g/dL (ref 3.5–5.0)
Alkaline Phosphatase: 65 U/L (ref 38–126)
Anion gap: 13 (ref 5–15)
BUN: 9 mg/dL (ref 6–20)
CO2: 22 mmol/L (ref 22–32)
Calcium: 9.9 mg/dL (ref 8.9–10.3)
Chloride: 98 mmol/L (ref 98–111)
Creatinine, Ser: 1.12 mg/dL (ref 0.61–1.24)
GFR, Estimated: >60 mL/min (ref >60)
Glucose, Bld: 141 mg/dL — ABNORMAL HIGH (ref 70–99)
Potassium: 4 mmol/L (ref 3.5–5.1)
Sodium: 133 mmol/L — ABNORMAL LOW (ref 135–145)
Total Bilirubin: 1 mg/dL (ref 0.0–1.2)
Total Protein: 7.3 g/dL (ref 6.5–8.1)

## 2023-12-12 LAB — TSH: TSH: 3.236 u[IU]/mL (ref 0.350–4.500)

## 2023-12-12 LAB — MAGNESIUM: Magnesium: 1.8 mg/dL (ref 1.7–2.4)

## 2023-12-12 LAB — TROPONIN I (HIGH SENSITIVITY)
Troponin I (High Sensitivity): 6 ng/L (ref <18)
Troponin I (High Sensitivity): 4 ng/L (ref <18)

## 2023-12-12 MED ORDER — IOHEXOL 350 MG/ML SOLN
100.0000 mL | Freq: Once | INTRAVENOUS | Status: AC | PRN
  Administered 2023-12-12: 100 mL via INTRAVENOUS

## 2023-12-12 MED ORDER — DEXAMETHASONE SODIUM PHOSPHATE 10 MG/ML IJ SOLN
10.0000 mg | Freq: Once | INTRAMUSCULAR | Status: AC
  Administered 2023-12-12: 10 mg via INTRAVENOUS
  Filled 2023-12-12: qty 1

## 2023-12-12 MED ORDER — DIPHENHYDRAMINE HCL 50 MG/ML IJ SOLN
12.5000 mg | Freq: Once | INTRAMUSCULAR | Status: AC
  Administered 2023-12-12: 12.5 mg via INTRAVENOUS
  Filled 2023-12-12: qty 1

## 2023-12-12 MED ORDER — HYDRALAZINE HCL 20 MG/ML IJ SOLN
10.0000 mg | Freq: Once | INTRAMUSCULAR | Status: AC
  Administered 2023-12-12: 10 mg via INTRAVENOUS
  Filled 2023-12-12: qty 1

## 2023-12-12 MED ORDER — HYDRALAZINE HCL 20 MG/ML IJ SOLN: 10.0000 mg | Freq: Once | INTRAMUSCULAR | Status: DC

## 2023-12-12 MED ORDER — PROCHLORPERAZINE EDISYLATE 10 MG/2ML IJ SOLN
10.0000 mg | Freq: Once | INTRAMUSCULAR | Status: AC
  Administered 2023-12-12: 10 mg via INTRAVENOUS
  Filled 2023-12-12: qty 2

## 2023-12-12 MED ORDER — HYDROMORPHONE HCL 1 MG/ML IJ SOLN
0.5000 mg | Freq: Once | INTRAMUSCULAR | Status: AC
  Administered 2023-12-12: 0.5 mg via INTRAVENOUS
  Filled 2023-12-12: qty 1

## 2023-12-12 NOTE — Discharge Instructions (Signed)
 As we discussed, your workup in the ER today was reassuring for acute findings.  Laboratory evaluation and CT imaging did not reveal any emergent cause of your symptoms.  You did have some incidental findings on imaging that should be addressed through primary.  Provider.  Please call them to schedule follow-up appointment.  Additionally, your blood pressure was quite elevated today.  We gave you medicine to bring this down and it seemed to help you feel better.  Therefore, I do suspect your abnormal blood pressures are contributing to you feeling unwell.  I recommend that you discuss changing her medicines around with your primary care provider at your next appointment.  Return if development of any new or worsening symptoms.

## 2023-12-12 NOTE — ED Triage Notes (Addendum)
 Pt woke up with dizziness, headache, chest pain. Pt is on BP meds and has been monitoring bp. Bps have been elevated for 2 weeks. Chest pain is central dull pressure and does not radiate. Also c/o SOB. Nothing makes pain better or worse. Sbp at home in 200s

## 2023-12-12 NOTE — ED Provider Notes (Signed)
 [facility] AT [facility] Provider Note   CSN: [id number] Arrival date & time: 12/12/23  1148     History  Chief Complaint  Patient presents with   Hypertension   Chest Pain    [facility] Wolf is a 58 y.o. male.  Patient with history of diabetes, hypertension, hyperlipidemia presents today with complaints of headache, chest pain, shortness of breath, hypertension. He states that he has had a headache for almost every day for the past 2 months. Pain wraps around his head. He saw his pcp for this who prescribed him gabapentin which he has been taking with minimal improvement. He states that during this time, he was having trouble with passing out, and was checking his blood pressure when this was happening and it was low. States that his pcp then switched him from amlodipine to losartan. He has been taking this and the passing out spells stopped. He continued to have headaches throughout these changes. He initially had a CT scan of head when the headaches started back in January and was diagnosed with sinusitis.  He did not have any sinus congestion at that time.  He was given antibiotics which he took in its entirety and nothing changed in his condition.  He had never had a headache before this started.  Also endorses intermittent chest pain and shortness of breath for the last few weeks.  When this started, he had a chest x-ray with his primary provider that was normal.  He specifically request that he not have a repeat x-ray today.  States he gets more winded with exertion.  His pain does not worsen with exertion.  His pain is mild.  It is not present currently.  States for the last 2 weeks he has been checking his blood pressures and they have been elevated.  He has been compliant with his blood pressure medicine during this time.  He has documented readings over 200.  He has not reached out to his primary provider regarding this yet.  He denies any vision changes,  nausea, vomiting, weakness, numbness/tingling.   The history is provided by the patient. No language interpreter was used.  Hypertension Associated symptoms include chest pain.  Chest Pain      Home Medications Prior to Admission medications   Medication Sig Start Date End Date Taking? Authorizing Provider  acetaminophen (TYLENOL) 500 MG tablet Take 1,000 mg by mouth every 6 (six) hours as needed for mild pain (pain score 1-3) or headache.   Yes [provider]  albuterol (VENTOLIN HFA) 108 (90 Base) MCG/ACT inhaler 2 puffs. 11/18/23  Yes [provider]  CREON 36000-114000 units CPEP capsule Take 36,000 Units by mouth 4 (four) times daily. 08/25/23  Yes [provider]  Cyanocobalamin  (VITAMIN B12) 1000 MCG TBCR Take 1,000 mcg by mouth daily. 03/11/22  Yes [provider]  gabapentin (NEURONTIN) 100 MG capsule Take 100 mg by mouth 3 (three) times daily as needed. 08/25/23  Yes [provider]  losartan (COZAAR) 50 MG tablet Take 50 mg by mouth daily. 11/22/23  Yes [provider]  meclizine (ANTIVERT) 25 MG tablet Take 25 mg by mouth 2 (two) times daily as needed for dizziness. 11/14/23  Yes [provider]  ondansetron (ZOFRAN) 8 MG tablet Take by mouth every 8 (eight) hours as needed for nausea or vomiting.   Yes [provider]  pantoprazole (PROTONIX) 40 MG tablet Take 40 mg by mouth daily. 11/22/23  Yes [provider]  pravastatin (PRAVACHOL) 20 MG tablet Take 20 mg by mouth daily. 10/22/23  Yes [provider]  Zachery Hermes INHUB 250-50 MCG/ACT AEPB Inhale 1 puff into the lungs 2 (two) times daily. 11/20/23  Yes [provider]      Allergies    Capsaicin and Choline fenofibrate    Review of Systems   Review of Systems  Cardiovascular:  Positive for chest pain.  All other systems reviewed and are negative.   Physical Exam Updated Vital Signs BP (!) 182/104   Pulse 70   Temp 97.8 F (36.6 C)    Resp 15   Ht 5\' 9"  (1.753 m)   Wt 76.2 kg   SpO2 99%   BMI 24.81 kg/m  Physical Exam Vitals and nursing note reviewed.  Constitutional:      General: He is not in acute distress.    Appearance: Normal appearance. He is normal weight. He is not ill-appearing, toxic-appearing or diaphoretic.  HENT:     Head: Normocephalic and atraumatic.  Neck:     Vascular: No carotid bruit.     Comments: No meningismus Cardiovascular:     Rate and Rhythm: Normal rate and regular rhythm.     Heart sounds: Normal heart sounds.  Pulmonary:     Effort: Pulmonary effort is normal. No respiratory distress.     Breath sounds: Normal breath sounds.  Abdominal:     General: Abdomen is flat.     Palpations: Abdomen is soft.     Tenderness: There is no abdominal tenderness.  Musculoskeletal:        General: Normal range of motion.     Cervical back: Full passive range of motion without pain and normal range of motion.     Right lower leg: No tenderness. No edema.     Left lower leg: No tenderness. No edema.  Skin:    General: Skin is warm and dry.  Neurological:     General: No focal deficit present.     Mental Status: He is alert and oriented to person, place, and time.     GCS: GCS eye subscore is 4. GCS verbal subscore is 5. GCS motor subscore is 6.     Sensory: Sensation is intact.     Motor: Motor function is intact.     Coordination: Coordination is intact.     Gait: Gait is intact.     Comments: Alert and oriented to self, place, time and event.    Speech is fluent, clear without dysarthria or dysphasia.    Strength 5/5 in upper/lower extremities   Sensation intact in upper/lower extremities    CN I not tested  CN II grossly intact visual fields bilaterally. Did not visualize posterior eye.  CN III, IV, VI PERRLA and EOMs intact bilaterally  CN V Intact sensation to sharp and light touch to the face  CN VII facial movements symmetric  CN VIII not tested  CN IX, X no uvula  deviation, symmetric rise of soft palate  CN XI 5/5 SCM and trapezius strength bilaterally  CN XII Midline tongue protrusion, symmetric L/R movements   Psychiatric:        Mood and Affect: Mood normal.        Behavior: Behavior normal.     ED Results / Procedures / Treatments   Labs (all labs ordered are listed, but only abnormal results are displayed) Labs Reviewed  COMPREHENSIVE METABOLIC PANEL WITH GFR - Abnormal; Notable for the following components:  Result Value   Sodium 133 (*)    Glucose, Bld 141 (*)    All other components within normal limits  CBC WITH DIFFERENTIAL/PLATELET  TSH  MAGNESIUM  TROPONIN I (HIGH SENSITIVITY)  TROPONIN I (HIGH SENSITIVITY)    EKG EKG Interpretation Date/Time:  Tuesday December 12 2023 12:41:10 EDT Ventricular Rate:  79 PR Interval:  170 QRS Duration:  92 QT Interval:  396 QTC Calculation: 454 R Axis:   25  Text Interpretation: Normal sinus rhythm Incomplete right bundle branch block No previous ECGs available Confirmed by Almond Army ([id number]) on 12/12/2023 5:38:10 PM  Radiology CT ANGIO HEAD NECK W WO CM Result Date: 12/12/2023 CLINICAL DATA:  Carotid artery aneurysm. Dizziness, headache, chest pain. EXAM: CT ANGIOGRAPHY HEAD AND NECK WITH AND WITHOUT CONTRAST TECHNIQUE: Multidetector CT imaging of the head and neck was performed using the standard protocol during bolus administration of intravenous contrast. Multiplanar CT image reconstructions and MIPs were obtained to evaluate the vascular anatomy. Carotid stenosis measurements (when applicable) are obtained utilizing NASCET criteria, using the distal internal carotid diameter as the denominator. RADIATION DOSE REDUCTION: This exam was performed according to the departmental dose-optimization program which includes automated exposure control, adjustment of the mA and/or kV according to patient size and/or use of iterative reconstruction technique. CONTRAST:  OMNIPAQUE  IOHEXOL   350 MG/ML SOLN COMPARISON:  Earlier same day CT head. FINDINGS: CTA NECK FINDINGS Aortic arch: Standard configuration of the aortic arch. Imaged portion shows no evidence of aneurysm or dissection. No significant stenosis of the major arch vessel origins. Pulmonary arteries: As permitted by contrast timing, there are no filling defects in the visualized pulmonary arteries. Subclavian arteries: The subclavian arteries are patent bilaterally. Right carotid system: Patent. Mild atherosclerosis along the proximal cervical ICA without hemodynamically significant stenosis. No evidence of dissection. Left carotid system: No evidence of dissection, stenosis (50% or greater), or occlusion. Minimal atherosclerosis at the carotid bifurcation. Tortuosity of the mid cervical ICA. Vertebral arteries: Codominant. No evidence of dissection, stenosis (50% or greater), or occlusion. Skeleton: No acute findings. Degenerative changes in the cervical spine. Other neck: The visualized airway is patent. No cervical lymphadenopathy. Multiple thyroid  nodules measuring up to 1.5 cm, largest in the upper right thyroid  lobe. Upper chest: Visualized lung apices are clear. Review of the MIP images confirms the above findings CTA HEAD FINDINGS ANTERIOR CIRCULATION: The intracranial ICAs are patent bilaterally. Atherosclerosis of the carotid siphons. Moderate stenosis of the bilateral paraclinoid ICAs. Mild stenosis of the left cavernous ICA. No proximal occlusion, aneurysm, or vascular malformation. MCAs: The middle cerebral arteries are patent bilaterally. ACAs: The anterior cerebral arteries are patent bilaterally. POSTERIOR CIRCULATION: No significant stenosis, proximal occlusion, aneurysm, or vascular malformation. PCAs: The posterior cerebral arteries are patent bilaterally. Pcomm: Not well visualized. SCAs: The superior cerebellar arteries are patent bilaterally. Basilar artery: Patent AICAs: Visualized on the left. PICAs: Visualized on the  right. Vertebral arteries: The intracranial vertebral arteries are patent. Venous sinuses: As permitted by contrast timing, patent. Anatomic variants: None Review of the MIP images confirms the above findings IMPRESSION: No large vessel occlusion. Multifocal atherosclerosis. Moderate stenosis of the bilateral paraclinoid ICAs. Multiple thyroid  nodules measuring up to 1.5 cm. Recommend correlation with nonemergent thyroid  ultrasound. Electronically Signed   By: Denny Flack M.D.   On: 12/12/2023 20:47   CT Angio Chest PE W and/or Wo Contrast Result Date: 12/12/2023 CLINICAL DATA:  High probability for PE. EXAM: CT ANGIOGRAPHY CHEST WITH CONTRAST TECHNIQUE: Multidetector CT imaging of  the chest was performed using the standard protocol during bolus administration of intravenous contrast. Multiplanar CT image reconstructions and MIPs were obtained to evaluate the vascular anatomy. RADIATION DOSE REDUCTION: This exam was performed according to the departmental dose-optimization program which includes automated exposure control, adjustment of the mA and/or kV according to patient size and/or use of iterative reconstruction technique. CONTRAST:  OMNIPAQUE  IOHEXOL  350 MG/ML SOLN COMPARISON:  None Available. FINDINGS: Cardiovascular: Satisfactory opacification of the pulmonary arteries to the segmental level. No evidence of pulmonary embolism. Heart is mildly enlarged. No pericardial effusion. Mediastinum/Nodes: There are bilateral thyroid nodules. The largest is on the right measuring 15 mm. There are no enlarged mediastinal or hilar lymph nodes. Visualized esophagus is within normal limits. Lungs/Pleura: Lungs are clear. No pleural effusion or pneumothorax. Upper Abdomen: No acute abnormality. Musculoskeletal: No chest wall abnormality. No acute or significant osseous findings. Review of the MIP images confirms the above findings. IMPRESSION: 1. No evidence for pulmonary embolism. 2. Mild cardiomegaly. 3.  Bilateral thyroid nodules measuring up to 15 mm. Recommend thyroid US  (ref: J Am Coll Radiol. 2015 Feb;12(2): 143-50). Electronically Signed   By: Tyron Gallon M.D.   On: 12/12/2023 20:28   CT Head Wo Contrast Result Date: 12/12/2023 CLINICAL DATA:  Headache, new onset (Age >= 51y) EXAM: CT HEAD WITHOUT CONTRAST TECHNIQUE: Contiguous axial images were obtained from the base of the skull through the vertex without intravenous contrast. RADIATION DOSE REDUCTION: This exam was performed according to the departmental dose-optimization program which includes automated exposure control, adjustment of the mA and/or kV according to patient size and/or use of iterative reconstruction technique. COMPARISON:  08/30/2023 FINDINGS: Brain: No acute intracranial abnormality. Specifically, no hemorrhage, hydrocephalus, mass lesion, acute infarction, or significant intracranial injury. Vascular: No hyperdense vessel or unexpected calcification. Skull: No acute calvarial abnormality. Sinuses/Orbits: No acute findings Other: None IMPRESSION: No acute intracranial abnormality. Electronically Signed   By: Janeece Mechanic M.D.   On: 12/12/2023 14:51    Procedures Procedures    Medications Ordered in ED Medications  HYDROmorphone  (DILAUDID ) injection 0.5 mg (has no administration in time range)  hydrALAZINE  (APRESOLINE ) injection 10 mg (has no administration in time range)  hydrALAZINE  (APRESOLINE ) injection 10 mg (10 mg Intravenous Given by Other 12/12/23 1902)  prochlorperazine  (COMPAZINE ) injection 10 mg (10 mg Intravenous Given by Other 12/12/23 1902)  diphenhydrAMINE  (BENADRYL ) injection 12.5 mg (12.5 mg Intravenous Given 12/12/23 1907)  dexamethasone  (DECADRON ) injection 10 mg (10 mg Intravenous Given by Other 12/12/23 1902)  HYDROmorphone  (DILAUDID ) injection 0.5 mg (0.5 mg Intravenous Given by Other 12/12/23 1901)  iohexol  (OMNIPAQUE ) 350 MG/ML injection 100 mL (100 mLs Intravenous Contrast Given 12/12/23 2022)    ED  Course/ Medical Decision Making/ A&P                                 Medical Decision Making Amount and/or Complexity of Data Reviewed Radiology: ordered.  Risk Prescription drug management.   This patient is a 58 y.o. male who presents to the ED for concern of headaches, chest pain, shortness of breath, hypertension, this involves an extensive number of treatment options, and is a complaint that carries with it a high risk of complications and morbidity. The emergent differential diagnosis prior to evaluation includes, but is not limited to,  Stroke, increased ICP, meningitis, CVA, intracranial tumor, venous sinus thrombosis, migraine, cluster headache, hypertension, drug related, tension headache, temporal arteritis, glaucoma, trigeminal  neuralgia, ACS, pericarditis, myocarditis, aortic dissection, PE, pneumothorax, esophageal rupture, pneumonia, reflux/PUD, biliary disease, pancreatitis, costochondritis, anxiety, CHF, pericardial effusion/tamponade, arrhythmias, ACS, COPD, asthma, bronchitis, anemia   This is not an exhaustive differential.   Past Medical History / Co-morbidities / Social History:  has a past medical history of Diabetes mellitus without complication (HCC), History of kidney stones, and Hyperlipidemia.  Additional history: Chart reviewed. Pertinent results include: Had grossly normal CT head in January.  Physical Exam: Physical exam performed. The pertinent findings include: Alert and oriented and neurologically intact without focal deficits.  No acute physical exam abnormalities  Lab Tests: I ordered, and personally interpreted labs.  The pertinent results include: NA 133.  No other acute laboratory abnormalities   Imaging Studies: I ordered imaging studies including CT head, CTA head and neck, CTA PE. I independently visualized and interpreted imaging which showed   No large vessel occlusion.   Multifocal atherosclerosis. Moderate stenosis of the bilateral  paraclinoid ICAs.   Multiple thyroid nodules measuring up to 1.5 cm. Recommend correlation with nonemergent thyroid ultrasound.  1. No evidence for pulmonary embolism. 2. Mild cardiomegaly. 3. Bilateral thyroid nodules measuring up to 15 mm. Recommend thyroid US   I agree with the radiologist interpretation.   Cardiac Monitoring:  The patient was maintained on a cardiac monitor.  My attending physician Dr. Leida Puna viewed and interpreted the cardiac monitored which showed an underlying rhythm of: sinus rhythm, no STEMI. I agree with this interpretation.   Medications: I ordered medication including Compazine , Benadryl , Decadron , Dilaudid  for pain control Reevaluation of the patient after these medicines showed that the patient improved. I have reviewed the patients home medicines and have made adjustments as needed.   Disposition: After consideration of the diagnostic results and the patients response to treatment, I feel that emergency department workup does not suggest an emergent condition requiring admission or immediate intervention beyond what has been performed at this time. The plan is: Discharge with close outpatient follow-up and return precautions.  Patient's workup is benign.  After above medications he feels better and is ready to go home.  Recommend that he discuss blood pressure management with his primary provider outpatient.  I did discuss with him the ICA stenosis is seen on his CT scan.  Lifestyle modifications discussed as well.  Will need follow-up evaluation of this through his primary provider.  I have informed him of this.  Have also discussed the need for thyroid ultrasound.  I wonder if his symptoms are driven by his abnormal blood pressures.  May need alterations in medications.  Again, this should be done through his primary provider who has been managing his blood pressure previously.  His blood pressure is controlled now after hydralazine  and he feels better.  He is  ready to go home.  Evaluation and diagnostic testing in the emergency department does not suggest an emergent condition requiring admission or immediate intervention beyond what has been performed at this time.  Plan for discharge with close PCP follow-up.  Patient is understanding and amenable with plan, educated on red flag symptoms that would prompt immediate return.  Patient discharged in stable condition.  Final Clinical Impression(s) / ED Diagnoses Final diagnoses:  Internal carotid artery stenosis, bilateral  Chronic nonintractable headache, unspecified headache type  Elevated blood pressure reading  Precordial chest pain  Thyroid nodule    Rx / DC Orders ED Discharge Orders     None     An After Visit Summary was printed and given  to the patient.     Fredna Jasper 12/12/23 2318    Almond Army, MD 12/13/23 [phone]

## 2023-12-12 NOTE — ED Provider Triage Note (Signed)
 Emergency Medicine Provider Triage Evaluation Note  Charles Wolf , a 59 y.o. male  was evaluated in triage.  Pt complains of intermittent lightheadedness when he goes to stand up for the past 2 months.  Also reports a on-and-off headache for the past 2 months, no history of headaches.  Denies any neck stiffness, fevers, or double vision.  Also reports intermittent shortness of breath but no chest pain.  Also reporting elevated blood pressures at home with the systolic of 160s to 200 and diastolic of around 100-120.  Reports no change in his blood pressure medications.  States he got a chest x-ray performed about 1 month ago.  He declines repeat chest x-ray today.  Review of Systems  Positive: As above Negative: As above  Physical Exam  BP (!) 164/102   Pulse 80   Temp 98.5 F (36.9 C) (Oral)   Resp 20   Ht 5\' 9"  (1.753 m)   Wt 76.2 kg   SpO2 100%   BMI 24.81 kg/m  Gen:   Awake, no distress   Resp:  Normal effort  MSK:   Moves extremities without difficulty   Medical Decision Making  Medically screening exam initiated at 1:04 PM.  Appropriate orders placed.  Charles Wolf was informed that the remainder of the evaluation will be completed by another provider, this initial triage assessment does not replace that evaluation, and the importance of remaining in the ED until their evaluation is complete.     [facility] Catena, PA-C 12/12/23 1306

## 2023-12-22 ENCOUNTER — Ambulatory Visit (INDEPENDENT_AMBULATORY_CARE_PROVIDER_SITE_OTHER): Admitting: Acute Care

## 2023-12-22 ENCOUNTER — Ambulatory Visit
Admission: RE | Admit: 2023-12-22 | Discharge: 2023-12-22 | Disposition: A | Discharge status: Home / Self Care | Source: Ambulatory Visit | Attending: Acute Care | Admitting: Acute Care

## 2023-12-22 DIAGNOSIS — F17210 Nicotine dependence, cigarettes, uncomplicated: Secondary | ICD-10-CM

## 2023-12-22 DIAGNOSIS — Z122 Encounter for screening for malignant neoplasm of respiratory organs: Secondary | ICD-10-CM | POA: Diagnosis not present

## 2023-12-22 DIAGNOSIS — Z87891 Personal history of nicotine dependence: Secondary | ICD-10-CM

## 2023-12-22 NOTE — Progress Notes (Addendum)
 Provider Attestation I agree with the documentation of the Shared Decision Making visit,  smoking cessation counseling if appropriate, and verification or eligibility for lung cancer screening as documented by the RN Nurse Navigator.   Lauraine PHEBE Lites, MSN, AGACNP-BC [place] [facility] See Amion for personal pager PCCM on call pager [phone]    Virtual Visit via Telephone Note  I connected with Charles Wolf on 12/22/23 at 12:30 PM EDT by telephone and verified that I am speaking with the correct person using two identifiers.  Location: Patient: Charles Wolf Provider: Laneta Speaks, RN   I discussed the limitations, risks, security and privacy concerns of performing an evaluation and management service by telephone and the availability of in person appointments. I also discussed with the patient that there may be a patient responsible charge related to this service. The patient expressed understanding and agreed to proceed.    Shared Decision Making Visit Lung Cancer Screening Program [phone])   Eligibility: Age 58 y.o. Pack Years Smoking History Calculation 42 (# packs/per year x # years smoked) Recent History of coughing up blood  no Unexplained weight loss? no ( >Than 15 pounds within the last 6 months ) Prior History Lung / other cancer no (Diagnosis within the last 5 years already requiring surveillance chest CT Scans). Smoking Status Current Smoker Former Smokers: Years since quit: n/a  Quit Date: n/a  Visit Components: Discussion included one or more decision making aids. yes Discussion included risk/benefits of screening. yes Discussion included potential follow up diagnostic testing for abnormal scans. yes Discussion included meaning and risk of over diagnosis. yes Discussion included meaning and risk of False Positives. yes Discussion included meaning of total radiation exposure. yes  Counseling Included: Importance of adherence  to annual lung cancer LDCT screening. yes Impact of comorbidities on ability to participate in the program. yes Ability and willingness to under diagnostic treatment. yes  Smoking Cessation Counseling: Current Smokers:  Discussed importance of smoking cessation. yes Information about tobacco cessation classes and interventions provided to patient. yes Patient provided with ticket for LDCT Scan. no Symptomatic Patient. no  Counseling(Intermediate counseling: > three minutes) 99406 Diagnosis Code: Tobacco Use Z72.0 Asymptomatic Patient yes  Counseling (Intermediate counseling: > three minutes counseling) [id number] Former Smokers:  Discussed the importance of maintaining cigarette abstinence. yes Diagnosis Code: Personal History of Nicotine Dependence. S12.108 Information about tobacco cessation classes and interventions provided to patient. Yes Patient provided with ticket for LDCT Scan. no Written Order for Lung Cancer Screening with LDCT placed in Epic. Yes (CT Chest Lung Cancer Screening Low Dose W/O CM) [id number] Z12.2-Screening of respiratory organs Z87.891-Personal history of nicotine dependence   Laneta Speaks, RN

## 2023-12-22 NOTE — Patient Instructions (Signed)

## 2023-12-25 ENCOUNTER — Encounter: Payer: Self-pay | Admitting: Acute Care

## 2024-01-18 ENCOUNTER — Other Ambulatory Visit: Payer: Self-pay | Admitting: Acute Care

## 2024-01-18 DIAGNOSIS — F17210 Nicotine dependence, cigarettes, uncomplicated: Secondary | ICD-10-CM

## 2024-01-18 DIAGNOSIS — Z122 Encounter for screening for malignant neoplasm of respiratory organs: Secondary | ICD-10-CM

## 2024-01-18 DIAGNOSIS — Z87891 Personal history of nicotine dependence: Secondary | ICD-10-CM

## 2024-02-09 DIAGNOSIS — E041 Nontoxic single thyroid nodule: Secondary | ICD-10-CM | POA: Diagnosis not present

## 2024-02-09 DIAGNOSIS — E042 Nontoxic multinodular goiter: Secondary | ICD-10-CM | POA: Diagnosis not present

## 2024-03-06 DIAGNOSIS — E1165 Type 2 diabetes mellitus with hyperglycemia: Secondary | ICD-10-CM | POA: Diagnosis not present

## 2024-03-06 DIAGNOSIS — I10 Essential (primary) hypertension: Secondary | ICD-10-CM | POA: Diagnosis not present

## 2024-03-06 DIAGNOSIS — E785 Hyperlipidemia, unspecified: Secondary | ICD-10-CM | POA: Diagnosis not present

## 2024-03-06 DIAGNOSIS — J441 Chronic obstructive pulmonary disease with (acute) exacerbation: Secondary | ICD-10-CM | POA: Diagnosis not present

## 2024-03-12 DIAGNOSIS — Z720 Tobacco use: Secondary | ICD-10-CM | POA: Diagnosis not present

## 2024-03-12 DIAGNOSIS — E1165 Type 2 diabetes mellitus with hyperglycemia: Secondary | ICD-10-CM | POA: Diagnosis not present

## 2024-03-12 DIAGNOSIS — I10 Essential (primary) hypertension: Secondary | ICD-10-CM | POA: Diagnosis not present

## 2024-03-12 DIAGNOSIS — R200 Anesthesia of skin: Secondary | ICD-10-CM | POA: Diagnosis not present

## 2024-03-25 ENCOUNTER — Encounter: Payer: Self-pay | Admitting: Neurology

## 2024-03-25 ENCOUNTER — Ambulatory Visit: Payer: Self-pay | Admitting: Neurology

## 2024-03-25 VITALS — BP 159/96 | HR 110 | Ht 69.0 in | Wt 166.0 lb

## 2024-03-25 DIAGNOSIS — G43701 Chronic migraine without aura, not intractable, with status migrainosus: Secondary | ICD-10-CM | POA: Insufficient documentation

## 2024-03-25 DIAGNOSIS — R0981 Nasal congestion: Secondary | ICD-10-CM | POA: Diagnosis not present

## 2024-03-25 MED ORDER — ALPRAZOLAM 0.5 MG PO TABS: 0.5000 mg | ORAL_TABLET | Freq: Every evening | ORAL | 0 refills | Status: AC | PRN

## 2024-03-25 MED ORDER — UBRELVY 100 MG PO TABS: 100.0000 mg | ORAL_TABLET | Freq: Every day | ORAL | 3 refills | Status: AC | PRN

## 2024-03-25 MED ORDER — NURTEC 75 MG PO TBDP: ORAL_TABLET | ORAL | Status: AC

## 2024-03-25 NOTE — Patient Instructions (Addendum)
 ASSESSMENT AND PLAN 58 y.o. year old male  here with:     1) chronic migraine , > 15  months and no aura .  no response to sumatriptan, no other migraine meds have been tried.  ENT has seen him, a pulmonary cancer screen was done. Neurontin  prevention failed,  beta blocker failed, sumatriptan.  Has HTN.  Grief: since loss of daughter on June 4.    .  Symptoms : Photophobia, valsalva increases headaches.  Nausea , dizziness - feeling slowed , BP is elevated.    2) history of sinus infections, concussions and of  heavy smoking,  some alcohol use    3) tremor in hands bilaterally and facial flushing, erythema and diaphoresis. Facial twitching  left eye .  tremor is not essential,  but most likely induced by med or substances.    PLAN :   1 ) MRI brain is next step, patient is not claustrophobic but restless.  Ruling out cerebellar ataxia,  stroke, inflammatory changes,    2)  I will order Xanax  0.5 mg pre medication.    3) Will give samples of Nurtec of Ubrelvy  for bridging over until MRI can be de one.   4) grief since loss of daughter on June 4.        I plan to follow up either personally or through our NP within 4 months.   Chronic Migraine Headache A migraine headache is throbbing pain that is usually on one side of the head. It can also cause other symptoms. A migraine is called a chronic migraine if it happens at least 15 days in a month for more than 3 months. Talk with your doctor about what things may bring on (trigger) your migraines. What are the causes? The exact cause of a migraine is not known. This condition may be brought on or caused by: Smoking. Some medicines, such as birth control or some blood pressure medicines. Certain substances in some foods or drinks. Foods and drinks, such as: Cheese. Chocolate. Alcohol. Caffeine. Other things that may bring on a migraine include: Periods. Stress. Getting too much or too little sleep. Feeling tired  (fatigue). Bright lights or loud noises. Certain smells. Weather changes and being at high altitude. What increases the risk? The following factors may make you more likely to have chronic migraines: Having migraines or family members who have them. Having a mental health condition, such as being sad (depressed) or feeling worried or nervous (anxious). Taking a lot of pain medicine. Having sleep problems. Having heart disease, diabetes, or being very overweight (obese). What are the signs or symptoms? Symptoms vary for each person. The pain may: Feel like it is pulsing or throbbing. Happen only on one side of the head. In some cases, the pain may be on both sides of the head or around the head or neck. Be so bad that it keeps you from doing daily activities. Get worse with activity. Make you feel like you may vomit (feel nauseous) or vomit. Get worse around bright lights, loud noises, or smells. Cause you to feel dizzy. A sign that you may have chronic migraines is when you start to have more and more migraines. How is this treated? This condition is treated with medicines that: Lessen pain and the feeling like you may vomit. Prevent migraines. Treatment may also include: Acupuncture. Changes to your diet or sleep. Relaxation training. Learning ways to control your body and breathing (biofeedback). Talk therapy to help you know and  deal with negative thoughts (cognitive behavioral therapy). Using a device that gives electrical stimulation to your nerves, which can help take away pain. A shot of medicine into the muscles of the face or head. Surgery, if the other treatments do not work. Follow these instructions at home: Medicines Take over-the-counter and prescription medicines only as told by your doctor. Ask your doctor if the medicine prescribed to you requires you to avoid driving or using machinery. Lifestyle  Do not drink alcohol. Do not smoke or use any products that  contain nicotine or tobacco. If you need help quitting, ask your doctor. Get 7-9 hours of sleep every night, or the amount of sleep recommended by your doctor. Lower the stress in your life. Ask your doctor about ways to do this. Stay at a healthy weight. Talk with your doctor if you need help losing weight. Get regular exercise. General instructions Keep a journal to find out if certain things bring on migraines. This can help you avoid those things. For example, write down: What you eat and drink. How much sleep you get. Any change to your diet or medicines. Lie down in a dark, quiet room when you have a migraine. Try placing a cool towel over your head when you have a migraine. Keep lights dim if bright lights bother you or make your migraines worse. Where to find more information Coalition for Headache and Migraine Patients (CHAMP): headachemigraine.org American Migraine Foundation: americanmigrainefoundation.org National Headache Foundation: headaches.org Contact a doctor if: Medicine does not help your migraine. Your pain keeps coming back even with medicine. Get help right away if: Your migraine becomes really bad and medicine does not help. You have a fever or stiff neck. You have trouble seeing. Your muscles are weak or you lose control of them. You lose your balance or have trouble walking. You feel like you may faint or you faint. You start having sudden, very bad headaches. You have a seizure. This information is not intended to replace advice given to you by your health care provider. Make sure you discuss any questions you have with your health care provider. Document Revised: 04/04/2022 Document Reviewed: 04/04/2022 Elsevier Patient Education  2024 Elsevier Inc.Migraine Headache A migraine headache is a very strong throbbing pain on one or both sides of your head. This type of headache can also cause other symptoms. It can last from 4 hours to 3 days. Talk with your  doctor about what things may bring on (trigger) this condition. What are the causes? The exact cause of a migraine is not known. This condition may be brought on or caused by: Smoking. Medicines, such as: Medicine used to treat chest pain (nitroglycerin). Birth control pills. Estrogen. Some blood pressure medicines. Certain substances in some foods or drinks. Foods and drinks, such as: Cheese. Chocolate. Alcohol. Caffeine. Doing physical activity that is very hard. Other things that may trigger a migraine headache include: Periods. Pregnancy. Hunger. Stress. Getting too much or too little sleep. Weather changes. Feeling tired (fatigue). What increases the risk? Being 56-83 years old. Being male. Having a family history of migraine headaches. Being Caucasian. Having a mental health condition, such as being sad (depressed) or feeling worried or nervous (anxious). Being very overweight (obese). What are the signs or symptoms? A throbbing pain. This pain may: Happen in any area of the head, such as on one or both sides. Make it hard to do daily activities. Get worse with physical activity. Get worse around bright lights, loud noises,  or smells. Other symptoms may include: Feeling like you may vomit (nauseous). Vomiting. Dizziness. Before a migraine headache starts, you may get warning signs (an aura). An aura may include: Seeing flashing lights or having blind spots. Seeing bright spots, halos, or zigzag lines. Having tunnel vision or blurred vision. Having numbness or a tingling feeling. Having trouble talking. Having weak muscles. After a migraine ends, you may have symptoms. These may include: Tiredness. Trouble thinking (concentrating). How is this treated? Taking medicines that: Relieve pain. Relieve the feeling like you may vomit. Prevent migraine headaches. Treatment may also include: Acupuncture. Lifestyle changes like avoiding foods that bring on  migraine headaches. Learning ways to control your body functions (biofeedback). Therapy to help you know and deal with negative thoughts (cognitive behavioral therapy). Follow these instructions at home: Medicines Take over-the-counter and prescription medicines only as told by your doctor. If told, take steps to prevent problems with pooping (constipation). You may need to: Drink enough fluid to keep your pee (urine) pale yellow. Take medicines. You will be told what medicines to take. Eat foods that are high in fiber. These include beans, whole grains, and fresh fruits and vegetables. Limit foods that are high in fat and sugar. These include fried or sweet foods. Ask your doctor if you should avoid driving or using machines while you are taking your medicine. Lifestyle  Do not drink alcohol. Do not smoke or use any products that contain nicotine or tobacco. If you need help quitting, ask your doctor. Get 7-9 hours of sleep each night, or the amount recommended by your doctor. Find ways to deal with stress, such as meditation, deep breathing, or yoga. Try to exercise often. This can help lessen how bad and how often your migraines happen. General instructions Keep a journal to find out what may bring on your migraine headaches. This can help you avoid those things. For example, write down: What you eat and drink. How much sleep you get. Any change to your medicines or diet. If you have a migraine headache: Avoid things that make your symptoms worse, such as bright lights. Lie down in a dark, quiet room. Do not drive or use machinery. Ask your doctor what activities are safe for you. Where to find more information Coalition for Headache and Migraine Patients (CHAMP): headachemigraine.org American Migraine Foundation: americanmigrainefoundation.org National Headache Foundation: headaches.org Contact a doctor if: You get a migraine headache that is different or worse than others you  have had. You have more than 15 days of headaches in one month. Get help right away if: Your migraine headache gets very bad. Your migraine headache lasts more than 72 hours. You have a fever or stiff neck. You have trouble seeing. Your muscles feel weak or like you cannot control them. You lose your balance a lot. You have trouble walking. You faint. You have a seizure. This information is not intended to replace advice given to you by your health care provider. Make sure you discuss any questions you have with your health care provider. Document Revised: 04/04/2022 Document Reviewed: 04/04/2022 Elsevier Patient Education  2024 [employer].

## 2024-03-25 NOTE — Progress Notes (Signed)
 SLEEP MEDICINE CLINIC    Provider:  Dedra Gores, MD  Primary Care Physician:  Douglass Ivanoff, MD [address] [place] [address]     Referring Provider: Dyane Anthony RAMAN, Fnp [address] [place],  [place] [address]          Chief Complaint according to patient   Patient presents with:     New Patient (Initial Visit)           HISTORY OF PRESENT ILLNESS:  Charles Wolf is a 58 y.o. male patient who is seen upon referral on 03/25/2024 from Anthony Dyane, NP,  for a headache evaluation.  Chief concern according to patient : Mr. Ferraris reports that he has for the last 9 months suffered of near daily headaches that have migrainous character they are associated with phonophobia blurred vision nausea dizziness.  He was given sumatriptan but it does not alleviate the headache, he had also history of multiple concussions, chronic sinusitis, he was already seen by ENT surgeons and diagnosed with an ethmoidal sphenoidal sinusitis, he has been tried on antibiotics but none of this has changed his headaches.  Spontaneously summer in the last 3 months he had a week without headaches but it was not in relationship to a new medication or procedure.  He has been for over 4 decades a heavy smoker 1 pack/day, He denies having any alcohol problems.  He also has a history of liver disease, and he is taking Creon for pain in the abdomen.   Suspected to reflect an pancreatic insufficiency. He had several concussions in his life , age 39 - 51.      HA medical history:  often had sinus headaches in the past, not daily, not chronic and not migraine-  2 years ago onset of first migraine .  NoTonsillectomy or sinus surgery , Family medical /sleep history: no  other family member with  migraines Social history: patient ran a Sport and exercise psychologist, maintenance.    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 The patient currently works daytime Tobacco use; yes .  1 ppd/ 43 years   ETOH use ; yes , alcohol  use 2-3 glasses on weekend nights.   Caffeine intake in form of Coffee( 2 cups a day ) Soda( /) Tea ( /)nor energy drinks Exercise in form of  walking .   Hobbies : yard work         Review of Systems: Out of a complete 14 system review, the patient complains of only the following symptoms, and all other reviewed systems are negative.:  Fatigue, sleepiness , snoring, fragmented sleep, Insomnia   How likely are you to doze in the following situations: 0 = not likely, 1 = slight chance, 2 = moderate chance, 3 = high chance   Sitting and Reading? Watching Television? 2 Sitting inactive in a public place (theater or meeting)? As a passenger in a car for an hour without a break? Lying down in the afternoon when circumstances permit? Sitting and talking to someone? Sitting quietly after lunch without alcohol? In a car, while stopped for a few minutes in traffic?   Total = 8/ 24 points   FSS endorsed at 44/ 63 points.   Social History   Socioeconomic History  Marital status: Married    Spouse name: Not on file   Number of children: 1   Years of education: Not on file   Highest education level: Not on file  Occupational History   Not on file  Tobacco Use   Smoking status: Every Day    Current packs/day: 1.00    Average packs/day: 1 pack/day for 42.6 years (42.6 ttl pk-yrs)    Types: Cigarettes    Start date: 73   Smokeless tobacco: Never  Vaping Use   Vaping status: Never Used  Substance and Sexual Activity   Alcohol use: Yes    Comment: socially   Drug use: Never   Sexual activity: Not on file  Other Topics Concern   Not on file  Social History Narrative   2025 - R handed, 1.5 cups of caffeine daily.    Social Drivers of Corporate investment banker Strain: Not on file  Food Insecurity: Not  on file  Transportation Needs: Not on file  Physical Activity: Not on file  Stress: Not on file  Social Connections: Not on file    Family History  Problem Relation Age of Onset   Hypertension Other    Hyperlipidemia Other    Diabetes Other     Past Medical History:  Diagnosis Date   Diabetes mellitus without complication (HCC)    controlled with diet   History of kidney stones    Hyperlipidemia     Past Surgical History:  Procedure Laterality Date   EXTRACORPOREAL SHOCK WAVE LITHOTRIPSY Left 10/01/2018   Procedure: EXTRACORPOREAL SHOCK WAVE LITHOTRIPSY (ESWL);  Surgeon: Cam Morene ORN, MD;  Location: [facility];  Service: Urology;  Laterality: Left;   GANGLION CYST EXCISION     GRAFT APPLICATION     to bone right wrist times 2   ROTATOR CUFF REPAIR Right      Current Outpatient Medications on File Prior to Visit  Medication Sig Dispense Refill   acetaminophen  (TYLENOL ) 500 MG tablet Take 1,000 mg by mouth every 6 (six) hours as needed for mild pain (pain score 1-3) or headache.     albuterol  (VENTOLIN  HFA) 108 (90 Base) MCG/ACT inhaler 2 puffs.     CREON 36000-114000 units CPEP capsule Take 36,000 Units by mouth 4 (four) times daily.     Cyanocobalamin  (VITAMIN B12) 1000 MCG TBCR Take 1,000 mcg by mouth daily.     gabapentin  (NEURONTIN ) 100 MG capsule Take 100 mg by mouth 3 (three) times daily as needed.     losartan  (COZAAR ) 50 MG tablet Take 50 mg by mouth daily.     meclizine (ANTIVERT) 25 MG tablet Take 25 mg by mouth 2 (two) times daily as needed for dizziness.     ondansetron  (ZOFRAN ) 8 MG tablet Take by mouth every 8 (eight) hours as needed for nausea or vomiting.     pantoprazole  (PROTONIX ) 40 MG tablet Take 40 mg by mouth daily.     pravastatin  (PRAVACHOL ) 20 MG tablet Take 20 mg by mouth daily.     SUMAtriptan (IMITREX) 50 MG tablet Take 50 mg by mouth once.     WIXELA INHUB 250-50 MCG/ACT AEPB Inhale 1 puff into the lungs 2 (two) times daily.     No current  facility-administered medications on file prior to visit.    Allergies  Allergen Reactions   Capsaicin Other (See Comments)    Other Reaction(s): burning   Choline Fenofibrate Nausea Only    Other Reaction(s): GI Upset  DIAGNOSTIC DATA (LABS, IMAGING, TESTING) - I reviewed patient records, labs, notes, testing and imaging myself where available.  Lab Results  Component Value Date   WBC 4.4 12/12/2023   HGB 14.9 12/12/2023   HCT 43.1 12/12/2023   MCV 95.6 12/12/2023   PLT 208 12/12/2023      Component Value Date/Time   NA 133 (L) 12/12/2023 1310   K 4.0 12/12/2023 1310   CL 98 12/12/2023 1310   CO2 22 12/12/2023 1310   GLUCOSE 141 (H) 12/12/2023 1310   BUN 9 12/12/2023 1310   CREATININE 1.12 12/12/2023 1310   CREATININE 1.14 05/04/2021 1110   CALCIUM 9.9 12/12/2023 1310   PROT 7.3 12/12/2023 1310   ALBUMIN 4.4 12/12/2023 1310   AST 36 12/12/2023 1310   AST 238 (HH) 05/04/2021 1110   ALT 32 12/12/2023 1310   ALT 193 (H) 05/04/2021 1110   ALKPHOS 65 12/12/2023 1310   BILITOT 1.0 12/12/2023 1310   BILITOT 1.1 05/04/2021 1110   GFRNONAA >60 12/12/2023 1310   GFRNONAA >60 05/04/2021 1110   No results found for: CHOL, HDL, LDLCALC, LDLDIRECT, TRIG, CHOLHDL No results found for: YHAJ8R No results found for: VITAMINB12 Lab Results  Component Value Date   TSH 3.236 12/12/2023    PHYSICAL EXAM:  Today's Vitals   03/25/24 1407 03/25/24 1410  BP: (!) 160/105 (!) 159/96  Pulse: (!) 110   Weight: 166 lb (75.3 kg)   Height: 5' 9 (1.753 m)    Body mass index is 24.51 kg/m.   Wt Readings from Last 3 Encounters:  03/25/24 166 lb (75.3 kg)  12/12/23 168 lb (76.2 kg)  05/31/22 178 lb 3.2 oz (80.8 kg)     Ht Readings from Last 3 Encounters:  03/25/24 5' 9 (1.753 m)  12/12/23 5' 9 (1.753 m)  04/29/21 5' 10.5 (1.791 m)      General: The patient is awake, alert and appears  in acute distress. The patient is well groomed. His face is flushed.   Head: Normocephalic, atraumatic. Neck is supple.  Mallampati 3,  neck circumference:15 inches . Nasal airflow barely patent.   Retrognathia is not seen.  Dental status: biological  Cardiovascular:  Regular rate and cardiac rhythm by pulse,  without distended neck veins. Respiratory: Lungs are clear to auscultation.  Skin:  Without evidence of ankle edema, or rash. Clammy. Flushed face.  Trunk: The patient's posture is erect.   NEUROLOGIC EXAM: The patient is awake and alert, oriented to place and time.   Memory subjective described as intact.  Attention span & concentration ability appears normal.  Speech is fluent,  without  dysarthria, dysphonia or aphasia.  Mood and affect are appropriate.   Cranial nerves: no loss of smell or taste reported  Pupils are equal and briskly reactive to light. Funduscopic exam deferred. .  Extraocular movements in vertical and horizontal planes were intact and without nystagmus.  No Diplopia. Pain with extraocular movements. Visual fields by finger perimetry are intact. Hearing was intact to soft voice and finger rubbing.   Facial sensation intact to fine touch.  Facial motor strength is symmetric and tongue is tremulous.  Facial twitch left eye . Neck ROM : rotation, tilt and flexion extension were normal for age and shoulder shrug was symmetrical.    Motor exam:  Symmetric bulk, tone and ROM.   Normal tone without cog- wheeling, symmetric grip strength .   Sensory:  def. Coordination: Rapid alternating movements in the fingers/hands were of normal  speed.  The Finger-to-nose maneuver was intact without evidence of ataxia, dysmetria but with resting and action tremor e-  ROMBERG positive , propulsive and retro-pulsive swaying. Tremor in face and hands increased,   Bilateral equal amplitude to the  tremor.   Gait and station: Patient could rise unassisted from a seated position, walked without assistive device.  Stance is of normal width/ base  and the patient turned with 4 steps.   Toe and heel walk were deferred.  Deep tendon reflexes: in the  upper and lower extremities are symmetric and intact.      ASSESSMENT AND PLAN 58 y.o. year old male  here with:    1) chronic migraine , > 15  months and no aura .  no response to sumatriptan, no other migraine meds have been tried.  ENT has seen him, a pulmonary cancer screen was done. Neurontin  prevention failed,  beta blocker failed, sumatriptan.  Has HTN.  SABRA  Symptoms : Photophobia, valsalva increases headaches.  Nausea , dizziness - feeling slowed , BP is elevated.   2) history of sinus infections, concussions and of  heavy smoking,  some alcohol use   3) tremor in hands bilaterally and facial flushing, erythema and diaphoresis. Facial twitching  left eye .  tremor is not essential,  but most likely induced by med or substances.   PLAN :   1 ) MRI brain is next step, patient is not claustrophobic but restless.  Ruling out cerebellar ataxia,  stroke, inflammatory changes,   2)  I will order Xanax  0.5 mg pre medication.   3)  Will give samples of  Nurtec ,  of Ubrelvy  for bridging over until MRI can be de one.     I plan to follow up either personally or through our NP within 4 months.   I would like to thank  Dyane Anthony RAMAN, Fnp [address] [place],  [place] [address] for allowing me to meet with and to take care of this pleasant patient.   Discussion of sleep hygiene setting bedtime and rise time,  hot shower  before bed time, no screen light in the bedroom, the bedroom should be cool, quiet and dark. Night lights should illuminate the floor not shine into your eyes. Golden glow  light is less intrusive than blue or cold light.  Read in a book with pages, not on a device. Consider audio books and soothing  sound -scapes.    After spending a total time of  45  minutes face to face and additional time for physical and neurologic examination, review of  laboratory studies,  personal review of imaging studies, reports and results of other testing and review of referral information / records as far as provided in visit,   Electronically signed by: Dedra Gores, MD 03/25/2024 2:31 PM  [facility] Sleep Board certified by The [employer] of Sleep Medicine and Diplomate of the [employer] of Sleep Medicine. Board certified In Neurology through the ABPN, Fellow of the [employer] of Neurology.

## 2024-03-26 ENCOUNTER — Telehealth: Payer: Self-pay | Admitting: Pharmacist

## 2024-03-26 NOTE — Telephone Encounter (Signed)
 Pharmacy Patient Advocate Encounter  Received notification from [facility] that Prior Authorization for Ubrelvy  100MG  tablets has been APPROVED from 03/26/2024 to 06/18/2024   PA #/Case ID/Reference #: [id number]

## 2024-03-26 NOTE — Telephone Encounter (Signed)
 Pharmacy Patient Advocate Encounter   Received notification from Patient Pharmacy that prior authorization for Ubrelvy  100MG  tablets is required/requested.   Insurance verification completed.   The patient is insured through [facility] .   Per test claim: PA required; PA submitted to above mentioned insurance via CoverMyMeds Key/confirmation #/[facility] Status is pending

## 2024-03-29 ENCOUNTER — Inpatient Hospital Stay (HOSPITAL_BASED_OUTPATIENT_CLINIC_OR_DEPARTMENT_OTHER)
Admission: EM | Admit: 2024-03-29 | Discharge: 2024-04-01 | DRG: 871 | Disposition: A | Discharge status: Home Health | Attending: Internal Medicine | Admitting: Internal Medicine

## 2024-03-29 ENCOUNTER — Emergency Department (HOSPITAL_BASED_OUTPATIENT_CLINIC_OR_DEPARTMENT_OTHER)

## 2024-03-29 ENCOUNTER — Other Ambulatory Visit: Payer: Self-pay

## 2024-03-29 ENCOUNTER — Encounter (HOSPITAL_BASED_OUTPATIENT_CLINIC_OR_DEPARTMENT_OTHER): Payer: Self-pay

## 2024-03-29 DIAGNOSIS — Z1152 Encounter for screening for COVID-19: Secondary | ICD-10-CM | POA: Diagnosis not present

## 2024-03-29 DIAGNOSIS — Z833 Family history of diabetes mellitus: Secondary | ICD-10-CM | POA: Diagnosis not present

## 2024-03-29 DIAGNOSIS — Z91048 Other nonmedicinal substance allergy status: Secondary | ICD-10-CM

## 2024-03-29 DIAGNOSIS — Z87442 Personal history of urinary calculi: Secondary | ICD-10-CM | POA: Diagnosis not present

## 2024-03-29 DIAGNOSIS — R7303 Prediabetes: Secondary | ICD-10-CM

## 2024-03-29 DIAGNOSIS — R0902 Hypoxemia: Secondary | ICD-10-CM | POA: Diagnosis present

## 2024-03-29 DIAGNOSIS — Z8249 Family history of ischemic heart disease and other diseases of the circulatory system: Secondary | ICD-10-CM

## 2024-03-29 DIAGNOSIS — K861 Other chronic pancreatitis: Secondary | ICD-10-CM | POA: Diagnosis not present

## 2024-03-29 DIAGNOSIS — G238 Other specified degenerative diseases of basal ganglia: Secondary | ICD-10-CM | POA: Diagnosis not present

## 2024-03-29 DIAGNOSIS — D649 Anemia, unspecified: Secondary | ICD-10-CM | POA: Diagnosis present

## 2024-03-29 DIAGNOSIS — R1110 Vomiting, unspecified: Secondary | ICD-10-CM | POA: Diagnosis present

## 2024-03-29 DIAGNOSIS — E785 Hyperlipidemia, unspecified: Secondary | ICD-10-CM | POA: Diagnosis not present

## 2024-03-29 DIAGNOSIS — Z79899 Other long term (current) drug therapy: Secondary | ICD-10-CM | POA: Diagnosis not present

## 2024-03-29 DIAGNOSIS — K860 Alcohol-induced chronic pancreatitis: Secondary | ICD-10-CM | POA: Diagnosis not present

## 2024-03-29 DIAGNOSIS — R0682 Tachypnea, not elsewhere classified: Secondary | ICD-10-CM | POA: Diagnosis not present

## 2024-03-29 DIAGNOSIS — R197 Diarrhea, unspecified: Secondary | ICD-10-CM | POA: Diagnosis not present

## 2024-03-29 DIAGNOSIS — K5792 Diverticulitis of intestine, part unspecified, without perforation or abscess without bleeding: Secondary | ICD-10-CM | POA: Diagnosis not present

## 2024-03-29 DIAGNOSIS — Z720 Tobacco use: Secondary | ICD-10-CM | POA: Insufficient documentation

## 2024-03-29 DIAGNOSIS — E871 Hypo-osmolality and hyponatremia: Secondary | ICD-10-CM | POA: Diagnosis present

## 2024-03-29 DIAGNOSIS — R059 Cough, unspecified: Secondary | ICD-10-CM | POA: Diagnosis not present

## 2024-03-29 DIAGNOSIS — E119 Type 2 diabetes mellitus without complications: Secondary | ICD-10-CM | POA: Diagnosis not present

## 2024-03-29 DIAGNOSIS — A419 Sepsis, unspecified organism: Secondary | ICD-10-CM | POA: Insufficient documentation

## 2024-03-29 DIAGNOSIS — G43701 Chronic migraine without aura, not intractable, with status migrainosus: Secondary | ICD-10-CM | POA: Diagnosis not present

## 2024-03-29 DIAGNOSIS — R000 Tachycardia, unspecified: Secondary | ICD-10-CM | POA: Diagnosis not present

## 2024-03-29 DIAGNOSIS — Z888 Allergy status to other drugs, medicaments and biological substances status: Secondary | ICD-10-CM

## 2024-03-29 DIAGNOSIS — R5381 Other malaise: Secondary | ICD-10-CM | POA: Diagnosis not present

## 2024-03-29 DIAGNOSIS — F17210 Nicotine dependence, cigarettes, uncomplicated: Secondary | ICD-10-CM | POA: Diagnosis not present

## 2024-03-29 DIAGNOSIS — Z716 Tobacco abuse counseling: Secondary | ICD-10-CM

## 2024-03-29 DIAGNOSIS — J189 Pneumonia, unspecified organism: Secondary | ICD-10-CM | POA: Diagnosis not present

## 2024-03-29 DIAGNOSIS — E861 Hypovolemia: Secondary | ICD-10-CM | POA: Diagnosis present

## 2024-03-29 DIAGNOSIS — I2510 Atherosclerotic heart disease of native coronary artery without angina pectoris: Secondary | ICD-10-CM | POA: Diagnosis not present

## 2024-03-29 DIAGNOSIS — K5730 Diverticulosis of large intestine without perforation or abscess without bleeding: Secondary | ICD-10-CM | POA: Diagnosis not present

## 2024-03-29 DIAGNOSIS — R0982 Postnasal drip: Secondary | ICD-10-CM | POA: Diagnosis not present

## 2024-03-29 DIAGNOSIS — G43909 Migraine, unspecified, not intractable, without status migrainosus: Secondary | ICD-10-CM | POA: Diagnosis not present

## 2024-03-29 DIAGNOSIS — R109 Unspecified abdominal pain: Secondary | ICD-10-CM | POA: Diagnosis not present

## 2024-03-29 DIAGNOSIS — K449 Diaphragmatic hernia without obstruction or gangrene: Secondary | ICD-10-CM | POA: Diagnosis not present

## 2024-03-29 DIAGNOSIS — Z83438 Family history of other disorder of lipoprotein metabolism and other lipidemia: Secondary | ICD-10-CM

## 2024-03-29 DIAGNOSIS — R1012 Left upper quadrant pain: Secondary | ICD-10-CM | POA: Diagnosis not present

## 2024-03-29 DIAGNOSIS — I10 Essential (primary) hypertension: Secondary | ICD-10-CM | POA: Diagnosis not present

## 2024-03-29 DIAGNOSIS — I700 Atherosclerosis of aorta: Secondary | ICD-10-CM | POA: Diagnosis not present

## 2024-03-29 DIAGNOSIS — Z0389 Encounter for observation for other suspected diseases and conditions ruled out: Secondary | ICD-10-CM | POA: Diagnosis not present

## 2024-03-29 HISTORY — DX: Essential (primary) hypertension: I10

## 2024-03-29 HISTORY — DX: Acute pancreatitis without necrosis or infection, unspecified: K85.90

## 2024-03-29 LAB — URINALYSIS, W/ REFLEX TO CULTURE (INFECTION SUSPECTED)
Bacteria, UA: NONE SEEN
Bilirubin Urine: NEGATIVE
Glucose, UA: 100 mg/dL — AB
Ketones, ur: 15 mg/dL — AB
Leukocytes,Ua: NEGATIVE
Nitrite: NEGATIVE
Protein, ur: 30 mg/dL — AB
Specific Gravity, Urine: 1.016 (ref 1.005–1.030)
pH: 6.5 (ref 5.0–8.0)

## 2024-03-29 LAB — COMPREHENSIVE METABOLIC PANEL WITH GFR
ALT: 24 U/L (ref 0–44)
AST: 26 U/L (ref 15–41)
Albumin: 4.5 g/dL (ref 3.5–5.0)
Alkaline Phosphatase: 80 U/L (ref 38–126)
Anion gap: 19 — ABNORMAL HIGH (ref 5–15)
BUN: 15 mg/dL (ref 6–20)
CO2: 20 mmol/L — ABNORMAL LOW (ref 22–32)
Calcium: 10.9 mg/dL — ABNORMAL HIGH (ref 8.9–10.3)
Chloride: 88 mmol/L — ABNORMAL LOW (ref 98–111)
Creatinine, Ser: 1.23 mg/dL (ref 0.61–1.24)
GFR, Estimated: >60 mL/min (ref >60)
Glucose, Bld: 166 mg/dL — ABNORMAL HIGH (ref 70–99)
Potassium: 3.7 mmol/L (ref 3.5–5.1)
Sodium: 128 mmol/L — ABNORMAL LOW (ref 135–145)
Total Bilirubin: 1 mg/dL (ref 0.0–1.2)
Total Protein: 8.6 g/dL — ABNORMAL HIGH (ref 6.5–8.1)

## 2024-03-29 LAB — CBC WITH DIFFERENTIAL/PLATELET
Abs Immature Granulocytes: 0.01 K/uL (ref 0.00–0.07)
Basophils Absolute: 0 K/uL (ref 0.0–0.1)
Basophils Relative: 1 %
Eosinophils Absolute: 0 K/uL (ref 0.0–0.5)
Eosinophils Relative: 0 %
HCT: 42.8 % (ref 39.0–52.0)
Hemoglobin: 15.7 g/dL (ref 13.0–17.0)
Immature Granulocytes: 0 %
Lymphocytes Relative: 8 %
Lymphs Abs: 0.3 K/uL — ABNORMAL LOW (ref 0.7–4.0)
MCH: 33.9 pg (ref 26.0–34.0)
MCHC: 36.7 g/dL — ABNORMAL HIGH (ref 30.0–36.0)
MCV: 92.4 fL (ref 80.0–100.0)
Monocytes Absolute: 0.8 K/uL (ref 0.1–1.0)
Monocytes Relative: 18 %
Neutro Abs: 3 K/uL (ref 1.7–7.7)
Neutrophils Relative %: 73 %
Platelets: 132 K/uL — ABNORMAL LOW (ref 150–400)
RBC: 4.63 MIL/uL (ref 4.22–5.81)
RDW: 12 % (ref 11.5–15.5)
WBC: 4.2 K/uL (ref 4.0–10.5)
nRBC: 0 % (ref 0.0–0.2)

## 2024-03-29 LAB — RESP PANEL BY RT-PCR (RSV, FLU A&B, COVID)  RVPGX2
Influenza A by PCR: NEGATIVE
Influenza B by PCR: NEGATIVE
Resp Syncytial Virus by PCR: NEGATIVE
SARS Coronavirus 2 by RT PCR: NEGATIVE

## 2024-03-29 LAB — LACTIC ACID, PLASMA: Lactic Acid, Venous: 1.3 mmol/L (ref 0.5–1.9)

## 2024-03-29 LAB — LIPASE, BLOOD: Lipase: 59 U/L — ABNORMAL HIGH (ref 11–51)

## 2024-03-29 MED ORDER — ENOXAPARIN SODIUM 40 MG/0.4ML IJ SOSY
40.0000 mg | PREFILLED_SYRINGE | INTRAMUSCULAR | Status: DC
  Administered 2024-03-30 – 2024-04-01 (×4): 40 mg via SUBCUTANEOUS
  Filled 2024-03-29 (×3): qty 0.4

## 2024-03-29 MED ORDER — PANTOPRAZOLE SODIUM 40 MG PO TBEC
40.0000 mg | DELAYED_RELEASE_TABLET | Freq: Every day | ORAL | Status: DC
  Administered 2024-03-30 – 2024-04-01 (×4): 40 mg via ORAL
  Filled 2024-03-29 (×3): qty 1

## 2024-03-29 MED ORDER — ONDANSETRON HCL 4 MG/2ML IJ SOLN: 4.0000 mg | Freq: Four times a day (QID) | INTRAMUSCULAR | Status: DC | PRN

## 2024-03-29 MED ORDER — SODIUM CHLORIDE 0.9 % IV SOLN
1.0000 g | INTRAVENOUS | Status: DC
  Administered 2024-03-29 – 2024-03-31 (×3): 1 g via INTRAVENOUS
  Filled 2024-03-29 (×4): qty 10

## 2024-03-29 MED ORDER — FLUTICASONE FUROATE-VILANTEROL 200-25 MCG/ACT IN AEPB
1.0000 | INHALATION_SPRAY | Freq: Every day | RESPIRATORY_TRACT | Status: DC
  Administered 2024-03-30 – 2024-04-01 (×4): 1 via RESPIRATORY_TRACT
  Filled 2024-03-29: qty 28

## 2024-03-29 MED ORDER — ONDANSETRON HCL 4 MG PO TABS: 4.0000 mg | ORAL_TABLET | Freq: Four times a day (QID) | ORAL | Status: DC | PRN

## 2024-03-29 MED ORDER — SODIUM CHLORIDE 0.9 % IV SOLN
500.0000 mg | INTRAVENOUS | Status: DC
  Administered 2024-03-29 – 2024-03-31 (×3): 500 mg via INTRAVENOUS
  Filled 2024-03-29 (×4): qty 5

## 2024-03-29 MED ORDER — SODIUM CHLORIDE 0.9 % IV BOLUS
2000.0000 mL | Freq: Once | INTRAVENOUS | Status: AC
  Administered 2024-03-29: 2000 mL via INTRAVENOUS

## 2024-03-29 MED ORDER — METOCLOPRAMIDE HCL 5 MG/ML IJ SOLN
10.0000 mg | Freq: Once | INTRAMUSCULAR | Status: AC
  Administered 2024-03-29: 10 mg via INTRAVENOUS
  Filled 2024-03-29: qty 2

## 2024-03-29 MED ORDER — POLYETHYLENE GLYCOL 3350 17 G PO PACK: 17.0000 g | PACK | Freq: Every day | ORAL | Status: DC | PRN

## 2024-03-29 MED ORDER — SODIUM CHLORIDE 0.9 % IV SOLN: INTRAVENOUS | Status: DC

## 2024-03-29 MED ORDER — SODIUM CHLORIDE 0.9 % IV SOLN: INTRAVENOUS | Status: AC

## 2024-03-29 MED ORDER — GUAIFENESIN 100 MG/5ML PO LIQD
5.0000 mL | ORAL | Status: DC | PRN
  Administered 2024-03-29 – 2024-04-01 (×13): 5 mL via ORAL
  Filled 2024-03-29 (×12): qty 15

## 2024-03-29 MED ORDER — GABAPENTIN 100 MG PO CAPS: 100.0000 mg | ORAL_CAPSULE | Freq: Three times a day (TID) | ORAL | Status: DC | PRN

## 2024-03-29 MED ORDER — LOSARTAN POTASSIUM 50 MG PO TABS
50.0000 mg | ORAL_TABLET | Freq: Every day | ORAL | Status: DC
  Administered 2024-03-30 – 2024-04-01 (×4): 50 mg via ORAL
  Filled 2024-03-29 (×3): qty 1

## 2024-03-29 MED ORDER — ACETAMINOPHEN 325 MG PO TABS
650.0000 mg | ORAL_TABLET | Freq: Four times a day (QID) | ORAL | Status: DC | PRN
  Administered 2024-03-29 – 2024-03-31 (×2): 650 mg via ORAL
  Filled 2024-03-29 (×2): qty 2

## 2024-03-29 MED ORDER — OXYCODONE HCL 5 MG PO TABS
5.0000 mg | ORAL_TABLET | ORAL | Status: DC | PRN
  Administered 2024-03-30 (×2): 5 mg via ORAL
  Filled 2024-03-29 (×2): qty 1

## 2024-03-29 MED ORDER — SODIUM CHLORIDE 0.9% FLUSH
3.0000 mL | Freq: Two times a day (BID) | INTRAVENOUS | Status: DC
  Administered 2024-03-29 – 2024-03-31 (×5): 3 mL via INTRAVENOUS

## 2024-03-29 MED ORDER — IOHEXOL 350 MG/ML SOLN
80.0000 mL | Freq: Once | INTRAVENOUS | Status: AC | PRN
  Administered 2024-03-29: 100 mL via INTRAVENOUS

## 2024-03-29 MED ORDER — IPRATROPIUM-ALBUTEROL 0.5-2.5 (3) MG/3ML IN SOLN: 3.0000 mL | Freq: Four times a day (QID) | RESPIRATORY_TRACT | Status: DC | PRN

## 2024-03-29 MED ORDER — PRAVASTATIN SODIUM 40 MG PO TABS
20.0000 mg | ORAL_TABLET | Freq: Every day | ORAL | Status: DC
  Administered 2024-03-30 – 2024-04-01 (×4): 20 mg via ORAL
  Filled 2024-03-29 (×3): qty 1

## 2024-03-29 MED ORDER — HYDROMORPHONE HCL 1 MG/ML IJ SOLN
0.5000 mg | INTRAMUSCULAR | Status: DC | PRN
  Administered 2024-03-29 – 2024-04-01 (×10): 0.5 mg via INTRAVENOUS
  Filled 2024-03-29 (×9): qty 1

## 2024-03-29 MED ORDER — ACETAMINOPHEN 650 MG RE SUPP
650.0000 mg | Freq: Four times a day (QID) | RECTAL | Status: DC | PRN
Start: 2024-03-29 — End: 2024-04-01

## 2024-03-29 NOTE — Progress Notes (Signed)
 Received patient's report over the phone from ED nurse, Maryjane RN. Awaiting for the patient to arrive to unit, will be transported by [facility].

## 2024-03-29 NOTE — ED Notes (Signed)
 X 1 attempt to call report to [facility] [phone]; nurse unavailable at this time; told to call back

## 2024-03-29 NOTE — H&P (Signed)
 History and Physical    Charles Wolf FMW:[id number] DOB: 14-Jan-1966 DOA: 03/29/2024  PCP: Dyane Anthony RAMAN, FNP   Patient coming from: Home   Chief Complaint: Cough, SOB, abdominal pain, N/V/D, aches   HPI: Charles Wolf is a 58 y.o. male with medical history significant for hypertension, hyperlipidemia, diet-controlled diabetes mellitus, chronic pancreatitis, and chronic migraine who presents with approximately 1 week of general aches, malaise, nausea, vomiting, diarrhea, and worsening cough and shortness of breath.  Cough is productive of thick sputum and the patient also notes that he has developed subjective fevers and chills.  He denies chest pain or leg swelling.  [facility] ED Course: Upon arrival to the ED, patient is found to be febrile to 39.3 C and saturating mid 90s on room air with tachypnea, tachycardia, and stable BP.  Labs are most notable for sodium 128, normal WBC, and normal lactate.  CTA chest is negative for PE but concerning for pneumonia.  Blood cultures were collected and the patient was given 2 L NS, Reglan , Rocephin , and azithromycin  in the ED.  He was transferred to [facility] for admission.  Review of Systems:  All other systems reviewed and apart from HPI, are negative.  Past Medical History:  Diagnosis Date   Diabetes mellitus without complication (HCC)    controlled with diet   History of kidney stones    Hyperlipidemia    Hypertension    Pancreatitis     Past Surgical History:  Procedure Laterality Date   EXTRACORPOREAL SHOCK WAVE LITHOTRIPSY Left 10/01/2018   Procedure: EXTRACORPOREAL SHOCK WAVE LITHOTRIPSY (ESWL);  Surgeon: Cam Morene ORN, MD;  Location: [facility] ORS;  Service: Urology;  Laterality: Left;   GANGLION CYST EXCISION     GRAFT APPLICATION     to bone right wrist times 2   ROTATOR CUFF REPAIR Right     Social History:   reports that he has been smoking cigarettes. He started smoking about 42 years ago. He has a  42.6 pack-year smoking history. He has never used smokeless tobacco. He reports current alcohol use. He reports that he does not use drugs.  Allergies  Allergen Reactions   Capsaicin Other (See Comments)    Other Reaction(s): burning   Choline Fenofibrate Nausea Only    Other Reaction(s): GI Upset    Family History  Problem Relation Age of Onset   Hypertension Other    Hyperlipidemia Other    Diabetes Other      Prior to Admission medications   Medication Sig Start Date End Date Taking? Authorizing Provider  acetaminophen  (TYLENOL ) 500 MG tablet Take 1,000 mg by mouth every 6 (six) hours as needed for mild pain (pain score 1-3) or headache.    [provider]  albuterol  (VENTOLIN  HFA) 108 (90 Base) MCG/ACT inhaler 2 puffs. 11/18/23   [provider]  ALPRAZolam  (XANAX ) 0.5 MG tablet Take 1 tablet (0.5 mg total) by mouth at bedtime as needed (for MRI  tests , premedication). 03/25/24   Dohmeier, Dedra, MD  CREON  [id number] units CPEP capsule Take 36,000 Units by mouth 4 (four) times daily. 08/25/23   [provider]  Cyanocobalamin  (VITAMIN B12) 1000 MCG TBCR Take 1,000 mcg by mouth daily. 03/11/22   [provider]  gabapentin  (NEURONTIN ) 100 MG capsule Take 100 mg by mouth 3 (three) times daily as needed. 08/25/23   [provider]  losartan  (COZAAR ) 50 MG tablet Take 50 mg by mouth daily. 11/22/23   [provider]  meclizine  (ANTIVERT ) 25 MG tablet Take 25 mg by mouth 2 (two) times daily as needed for dizziness. 11/14/23   [provider]  ondansetron  (ZOFRAN ) 8 MG tablet Take by mouth every 8 (eight) hours as needed for nausea or vomiting.    [provider]  pantoprazole  (PROTONIX ) 40 MG tablet Take 40 mg by mouth daily. 11/22/23   [provider]  pravastatin  (PRAVACHOL ) 20 MG tablet Take 20 mg by mouth daily. 10/22/23   [provider]  Rimegepant Sulfate (NURTEC) 75 MG TBDP Take at onset of headache ,  can be taken every other day. 03/25/24   Dohmeier, Dedra, MD  SUMAtriptan (IMITREX) 50 MG tablet Take 50 mg by mouth once. 12/14/23   [provider]  Ubrogepant  (UBRELVY ) 100 MG TABS Take 1 tablet (100 mg total) by mouth daily as needed. 03/25/24   Dohmeier, Dedra, MD  NAPOLEON INHUB 250-50 MCG/ACT AEPB Inhale 1 puff into the lungs 2 (two) times daily. 11/20/23   [provider]    Physical Exam: Vitals:   03/29/24 2001 03/29/24 2227 03/29/24 2301 03/29/24 2347  BP: (!) 154/97 (!) 162/99  127/75  Pulse: (!) 128 (!) 125  (!) 123  Resp: (!) 29 19  20   Temp: 98.4 F (36.9 C) (!) 102.8 F (39.3 C)  (!) 101.1 F (38.4 C)  TempSrc: Oral Oral  Oral  SpO2: 96% 95% 95% 95%  Weight:  76.6 kg    Height:  5' 9 (1.753 m)      Constitutional: NAD, no pallor or diaphoresis   Eyes: PERTLA, lids and conjunctivae normal ENMT: Mucous membranes are moist. Posterior pharynx clear of any exudate or lesions.   Neck: supple, no masses  Respiratory: coarse rales on left. No wheezing. No accessory muscle use.  Cardiovascular: S1 & S2 heard, regular rate and rhythm. No extremity edema.  Abdomen: No tenderness, soft. Bowel sounds active.  Musculoskeletal: no clubbing / cyanosis. No joint deformity upper and lower extremities.   Skin: no significant rashes, lesions, ulcers. Warm, dry, well-perfused. Neurologic: CN 2-12 grossly intact. Moving all extremities. Alert and oriented.  Psychiatric: Calm. Cooperative.    Labs and Imaging on Admission: I have personally reviewed following labs and imaging studies  CBC: Recent Labs  Lab 03/29/24 1710  WBC 4.2  NEUTROABS 3.0  HGB 15.7  HCT 42.8  MCV 92.4  PLT 132*   Basic Metabolic Panel: Recent Labs  Lab 03/29/24 1710  NA 128*  K 3.7  CL 88*  CO2 20*  GLUCOSE 166*  BUN 15  CREATININE 1.23  CALCIUM 10.9*   GFR: Estimated Creatinine Clearance: 65.5 mL/min (by C-G formula based on SCr of 1.23 mg/dL). Liver Function Tests: Recent  Labs  Lab 03/29/24 1710  AST 26  ALT 24  ALKPHOS 80  BILITOT 1.0  PROT 8.6*  ALBUMIN 4.5   Recent Labs  Lab 03/29/24 1710  LIPASE 59*   No results for input(s): AMMONIA in the last 168 hours. Coagulation Profile: No results for input(s): INR, PROTIME in the last 168 hours. Cardiac Enzymes: No results for input(s): CKTOTAL, CKMB, CKMBINDEX, TROPONINI in the last 168 hours. BNP (last 3 results) No results for input(s): PROBNP in the last 8760 hours. HbA1C: No results for input(s): HGBA1C in the last 72 hours. CBG: No results for input(s): GLUCAP in the last 168 hours. Lipid Profile: No results for input(s): CHOL, HDL, LDLCALC, TRIG, CHOLHDL, LDLDIRECT in the last 72 hours. Thyroid  Function Tests: No results for  input(s): TSH, T4TOTAL, FREET4, T3FREE, THYROIDAB in the last 72 hours. Anemia Panel: No results for input(s): VITAMINB12, FOLATE, FERRITIN, TIBC, IRON, RETICCTPCT in the last 72 hours. Urine analysis:    Component Value Date/Time   COLORURINE ORANGE (A) 03/29/2024 1850   APPEARANCEUR CLEAR 03/29/2024 1850   LABSPEC 1.016 03/29/2024 1850   PHURINE 6.5 03/29/2024 1850   GLUCOSEU 100 (A) 03/29/2024 1850   HGBUR TRACE (A) 03/29/2024 1850   BILIRUBINUR NEGATIVE 03/29/2024 1850   KETONESUR 15 (A) 03/29/2024 1850   PROTEINUR 30 (A) 03/29/2024 1850   NITRITE NEGATIVE 03/29/2024 1850   LEUKOCYTESUR NEGATIVE 03/29/2024 1850   Sepsis Labs: @LABRCNTIP (procalcitonin:4,lacticidven:4) ) Recent Results (from the past 240 hours)  Resp panel by RT-PCR (RSV, Flu A&B, Covid) Anterior Nasal Swab     Status: None   Collection Time: 03/29/24  5:10 PM   Specimen: Anterior Nasal Swab  Result Value Ref Range Status   SARS Coronavirus 2 by RT PCR NEGATIVE NEGATIVE Final    Comment: (NOTE) SARS-CoV-2 target nucleic acids are NOT DETECTED.  The SARS-CoV-2 RNA is generally detectable in upper respiratory specimens during the  acute phase of infection. The lowest concentration of SARS-CoV-2 viral copies this assay can detect is 138 copies/mL. A negative result does not preclude SARS-Cov-2 infection and should not be used as the sole basis for treatment or other patient management decisions. A negative result may occur with  improper specimen collection/handling, submission of specimen other than nasopharyngeal swab, presence of viral mutation(s) within the areas targeted by this assay, and inadequate number of viral copies(<138 copies/mL). A negative result must be combined with clinical observations, patient history, and epidemiological information. The expected result is Negative.  Fact Sheet for Patients:  BloggerCourse.com  Fact Sheet for Healthcare Providers:  SeriousBroker.it  This test is no t yet approved or cleared by the [place]  FDA and  has been authorized for detection and/or diagnosis of SARS-CoV-2 by FDA under an Emergency Use Authorization (EUA). This EUA will remain  in effect (meaning this test can be used) for the duration of the COVID-19 declaration under Section 564(b)(1) of the Act, 21 U.S.C.section 360bbb-3(b)(1), unless the authorization is terminated  or revoked sooner.       Influenza A by PCR NEGATIVE NEGATIVE Final   Influenza B by PCR NEGATIVE NEGATIVE Final    Comment: (NOTE) The Xpert Xpress SARS-CoV-2/FLU/RSV plus assay is intended as an aid in the diagnosis of influenza from Nasopharyngeal swab specimens and should not be used as a sole basis for treatment. Nasal washings and aspirates are unacceptable for Xpert Xpress SARS-CoV-2/FLU/RSV testing.  Fact Sheet for Patients: BloggerCourse.com  Fact Sheet for Healthcare Providers: SeriousBroker.it  This test is not yet approved or cleared by the [place]  FDA and has been authorized for detection and/or  diagnosis of SARS-CoV-2 by FDA under an Emergency Use Authorization (EUA). This EUA will remain in effect (meaning this test can be used) for the duration of the COVID-19 declaration under Section 564(b)(1) of the Act, 21 U.S.C. section 360bbb-3(b)(1), unless the authorization is terminated or revoked.     Resp Syncytial Virus by PCR NEGATIVE NEGATIVE Final    Comment: (NOTE) Fact Sheet for Patients: BloggerCourse.com  Fact Sheet for Healthcare Providers: SeriousBroker.it  This test is not yet approved or cleared by the [place]  FDA and has been authorized for detection and/or diagnosis of SARS-CoV-2 by FDA under an Emergency Use Authorization (EUA). This EUA will remain in effect (meaning this test can  be used) for the duration of the COVID-19 declaration under Section 564(b)(1) of the Act, 21 U.S.C. section 360bbb-3(b)(1), unless the authorization is terminated or revoked.  Performed at [facility], [address] [place], [place] [address]      Radiological Exams on Admission: CT Angio Chest PE W/Cm &/Or Wo Cm Result Date: 03/29/2024 CLINICAL DATA:  Pulmonary embolism (PE) suspected, high prob EXAM: CT ANGIOGRAPHY CHEST WITH CONTRAST TECHNIQUE: Multidetector CT imaging of the chest was performed using the standard protocol during bolus administration of intravenous contrast. Multiplanar CT image reconstructions and MIPs were obtained to evaluate the vascular anatomy. RADIATION DOSE REDUCTION: This exam was performed according to the departmental dose-optimization program which includes automated exposure control, adjustment of the mA and/or kV according to patient size and/or use of iterative reconstruction technique. CONTRAST:  OMNIPAQUE  IOHEXOL  350 MG/ML SOLN COMPARISON:  Dec 22, 2023 FINDINGS: Pulmonary Embolism: No pulmonary embolism. Cardiovascular: No cardiomegaly or pericardial  effusion.Multi-vessel coronary atherosclerosis.No aortic aneurysm. Mediastinum/Nodes: No mediastinal mass. No mediastinal, hilar, or axillary lymphadenopathy. Lungs/Pleura: The midline trachea and bronchi are patent. Scattered tree-in-bud nodularity noted throughout the right lung. More dense consolidation in the basilar segments of the left lower lobe. Scattered tree-in-bud nodularity is also noted throughout the left upper and left lower lobes. No pleural effusion or pneumothorax. Musculoskeletal: No acute fracture or destructive bone lesion. Multilevel degenerative disc disease of the spine. Upper Abdomen: For findings below the diaphragm, please see the separately dictated CT of the abdomen and pelvis report, which was performed concurrently. Review of the MIP images confirms the above findings. IMPRESSION: 1. Left lower lobe pneumonia with endobronchial spread of infection throughout both the left and right lungs. No pleural effusion. A 2 view chest radiograph in 6-12 weeks is recommended to document resolution once treatment is complete. 2. No pulmonary embolism. 3. For findings below the diaphragm, please see the separately dictated CT of the abdomen and pelvis report, which was performed concurrently. Aortic Atherosclerosis (ICD10-I70.0). Electronically Signed   By: Rogelia Myers M.D.   On: 03/29/2024 19:27   CT ABDOMEN PELVIS W CONTRAST Result Date: 03/29/2024 CLINICAL DATA:  Abdominal pain, acute, nonlocalized EXAM: CT ABDOMEN AND PELVIS WITH CONTRAST TECHNIQUE: Multidetector CT imaging of the abdomen and pelvis was performed using the standard protocol following bolus administration of intravenous contrast. RADIATION DOSE REDUCTION: This exam was performed according to the departmental dose-optimization program which includes automated exposure control, adjustment of the mA and/or kV according to patient size and/or use of iterative reconstruction technique. CONTRAST:  OMNIPAQUE  IOHEXOL  350  MG/ML SOLN COMPARISON:  February 09, 2022 FINDINGS: Lower chest: For findings above the diaphragm, please see the separately dictated CT of the chest report, which was performed concurrently. Hepatobiliary: No mass.Diffuse hepatic steatosis.Decompressed gallbladder without radiopaque stones or wall thickening. No intrahepatic or extrahepatic biliary ductal dilation. The portal veins are patent. Pancreas: No mass or main ductal dilation. No peripancreatic inflammation or fluid collection. Spleen: Normal size. No mass. Adrenals/Urinary Tract: No adrenal masses. No renal mass. Punctate nonobstructive left upper pole calculus. No hydronephrosis. Circumferential wall thickening of the urinary bladder. Stomach/Bowel: Small hiatal hernia. The stomach is decompressed without focal abnormality. No small bowel wall thickening or inflammation. No small bowel obstruction.Normal appendix. Descending and sigmoid colonic diverticulosis. No changes of acute diverticulitis. Vascular/Lymphatic: No aortic aneurysm. Diffuse aortoiliac atherosclerosis. No intraabdominal or pelvic lymphadenopathy. Reproductive: No prostatomegaly.No free pelvic fluid. Other: No pneumoperitoneum, ascites, or mesenteric inflammation. Musculoskeletal: No acute fracture or destructive lesion.  Multilevel degenerative disc disease of the spine. IMPRESSION: 1. Circumferential wall thickening of the urinary bladder, which may be due to underdistension. If there is concern for acute cystitis, correlation with urinalysis would be recommended. 2. Punctate nonobstructive left upper pole calyceal calculus. No hydronephrosis. 3. Descending and sigmoid colonic diverticulosis. No changes of acute diverticulitis. 4. Diffuse hepatic steatosis. Aortic Atherosclerosis (ICD10-I70.0). Electronically Signed   By: Rogelia Myers M.D.   On: 03/29/2024 19:21   CT Head Wo Contrast Result Date: 03/29/2024 CLINICAL DATA:  Migraine headache for several days, initial encounter EXAM: CT  HEAD WITHOUT CONTRAST TECHNIQUE: Contiguous axial images were obtained from the base of the skull through the vertex without intravenous contrast. RADIATION DOSE REDUCTION: This exam was performed according to the departmental dose-optimization program which includes automated exposure control, adjustment of the mA and/or kV according to patient size and/or use of iterative reconstruction technique. COMPARISON:  12/12/2023 FINDINGS: Brain: No evidence of acute infarction, hemorrhage, hydrocephalus, extra-axial collection or mass lesion/mass effect. Mild basal ganglia calcifications are noted Vascular: No hyperdense vessel or unexpected calcification. Skull: Normal. Negative for fracture or focal lesion. Sinuses/Orbits: Mucosal thickening is noted within the left maxillary and sphenoid sinuses progressed in the interval from the prior exam. Other: None IMPRESSION: No acute intracranial abnormality noted. Mucosal thickening in the paranasal sinuses. Electronically Signed   By: Oneil Devonshire M.D.   On: 03/29/2024 19:12   DG Chest Port 1 View Result Date: 03/29/2024 CLINICAL DATA:  Cough. EXAM: PORTABLE CHEST 1 VIEW COMPARISON:  November 15, 2023. FINDINGS: The heart size and mediastinal contours are within normal limits. Both lungs are clear. The visualized skeletal structures are unremarkable. IMPRESSION: No active disease. Electronically Signed   By: Lynwood Landy Raddle M.D.   On: 03/29/2024 16:50    EKG: Independently reviewed. Sinus tachycardia, rate 134.   Assessment/Plan   1. Pneumonia  - Continue Rocephin , azithromycin , and supportive care, monitor cultures and clinical course    2. Hyponatremia  - Serum sodium is 128 in the setting of hypovolemia  - Continue IVF hydration and repeat chem panel in am    3. N/V/D  - Exam is benign, no acute findings noted on CT, and no vomiting or diarrhea since presentation  - Continue IVF hydration, monitor electrolytes, treat symptoms   4. Hypertension  - Losartan      5. HLD  - Pravastatin      DVT prophylaxis: Lovenox   Code Status: Full  Level of Care: Level of care: Telemetry Medical Family Communication: None present  Disposition Plan:  Patient is from: home  Anticipated d/c is to: Home  Anticipated d/c date is: 8/9 or 03/31/24  Patient currently: Pending improved respiratory status and stable vitals  Consults called: None  Admission status: observation     Evalene GORMAN Sprinkles, MD [facility]  03/30/2024, 2:20 AM

## 2024-03-29 NOTE — ED Provider Notes (Signed)
 [place] EMERGENCY DEPARTMENT AT [facility] Provider Note   CSN: [id number] Arrival date & time: 03/29/24  1545     Patient presents with: Headache and Abdominal Pain   Stanislaw Acton is a 58 y.o. male.   58 year old male presents with several day history of multiple symptoms.  Patient notes URI symptoms consisting of facial pressure along with postnasal drip and ear discomfort.  States has had nonproductive cough.  Some subjective fever and chills.  Cough has been nonproductive.  Notes increased dyspnea exertion.  Notes that he has also had a headache for several days.  States he does have a history of chronic headaches.  Patient has had some abdominal discomfort.  Denies any diarrhea.  Denies any new focal weakness.       Prior to Admission medications   Medication Sig Start Date End Date Taking? Authorizing Provider  acetaminophen  (TYLENOL ) 500 MG tablet Take 1,000 mg by mouth every 6 (six) hours as needed for mild pain (pain score 1-3) or headache.    [provider]  albuterol  (VENTOLIN  HFA) 108 (90 Base) MCG/ACT inhaler 2 puffs. 11/18/23   [provider]  ALPRAZolam  (XANAX ) 0.5 MG tablet Take 1 tablet (0.5 mg total) by mouth at bedtime as needed (for MRI  tests , premedication). 03/25/24   Dohmeier, Dedra, MD  CREON 36000-114000 units CPEP capsule Take 36,000 Units by mouth 4 (four) times daily. 08/25/23   [provider]  Cyanocobalamin  (VITAMIN B12) 1000 MCG TBCR Take 1,000 mcg by mouth daily. 03/11/22   [provider]  gabapentin  (NEURONTIN ) 100 MG capsule Take 100 mg by mouth 3 (three) times daily as needed. 08/25/23   [provider]  losartan  (COZAAR ) 50 MG tablet Take 50 mg by mouth daily. 11/22/23   [provider]  meclizine (ANTIVERT) 25 MG tablet Take 25 mg by mouth 2 (two) times daily as needed for dizziness. 11/14/23   [provider]  ondansetron  (ZOFRAN ) 8 MG tablet Take by mouth every 8 (eight) hours  as needed for nausea or vomiting.    [provider]  pantoprazole  (PROTONIX ) 40 MG tablet Take 40 mg by mouth daily. 11/22/23   [provider]  pravastatin  (PRAVACHOL ) 20 MG tablet Take 20 mg by mouth daily. 10/22/23   [provider]  Rimegepant Sulfate (NURTEC) 75 MG TBDP Take at onset of headache , can be taken every other day. 03/25/24   Dohmeier, Dedra, MD  SUMAtriptan (IMITREX) 50 MG tablet Take 50 mg by mouth once. 12/14/23   [provider]  Ubrogepant  (UBRELVY ) 100 MG TABS Take 1 tablet (100 mg total) by mouth daily as needed. 03/25/24   Dohmeier, Dedra, MD  NAPOLEON INHUB 250-50 MCG/ACT AEPB Inhale 1 puff into the lungs 2 (two) times daily. 11/20/23   [provider]    Allergies: Capsaicin and Choline fenofibrate    Review of Systems  All other systems reviewed and are negative.   Updated Vital Signs BP (!) 131/98 (BP Location: Right Arm)   Pulse (!) 133   Temp 98.5 F (36.9 C)   Resp 17   Ht 1.753 m (5' 9)   Wt 75.3 kg   SpO2 98%   BMI 24.51 kg/m   Physical Exam Vitals and nursing note reviewed.  Constitutional:      General: He is not in acute distress.    Appearance: Normal appearance. He is well-developed. He is not toxic-appearing.  HENT:     Head: Normocephalic and  atraumatic.   Eyes:     General: Lids are normal.     Conjunctiva/sclera: Conjunctivae normal.     Pupils: Pupils are equal, round, and reactive to light.  Neck:     Thyroid : No thyroid  mass.     Trachea: No tracheal deviation.  Cardiovascular:     Rate and Rhythm: Regular rhythm. Tachycardia present.     Heart sounds: Normal heart sounds. No murmur heard.    No gallop.  Pulmonary:     Effort: Prolonged expiration present. No respiratory distress.     Breath sounds: No stridor. Examination of the right-lower field reveals decreased breath sounds. Examination of the left-lower field reveals decreased breath sounds. Decreased breath sounds present. No  wheezing, rhonchi or rales.  Abdominal:     General: There is no distension.     Palpations: Abdomen is soft.     Tenderness: There is no abdominal tenderness. There is no rebound.  Musculoskeletal:        General: No tenderness. Normal range of motion.     Cervical back: Normal range of motion and neck supple.  Skin:    General: Skin is warm and dry.     Findings: No abrasion or rash.  Neurological:     Mental Status: He is alert and oriented to person, place, and time. Mental status is at baseline.     GCS: GCS eye subscore is 4. GCS verbal subscore is 5. GCS motor subscore is 6.     Cranial Nerves: No cranial nerve deficit.     Sensory: No sensory deficit.     Motor: Motor function is intact.  Psychiatric:        Attention and Perception: Attention normal.        Speech: Speech normal.        Behavior: Behavior normal.     (all labs ordered are listed, but only abnormal results are displayed) Labs Reviewed  RESP PANEL BY RT-PCR (RSV, FLU A&B, COVID)  RVPGX2  LIPASE, BLOOD  CBC WITH DIFFERENTIAL/PLATELET  COMPREHENSIVE METABOLIC PANEL WITH GFR  URINALYSIS, W/ REFLEX TO CULTURE (INFECTION SUSPECTED)    EKG: EKG Interpretation Date/Time:  Friday March 29 2024 16:11:39 EDT Ventricular Rate:  134 PR Interval:  144 QRS Duration:  90 QT Interval:  296 QTC Calculation: 442 R Axis:   54  Text Interpretation: Sinus tachycardia Otherwise normal ECG When compared with ECG of 12-Dec-2023 12:41, Vent. rate has increased BY  55 BPM Confirmed by Dasie Faden ([id number]) on 03/29/2024 4:20:09 PM  Radiology: No results found.   Procedures   Medications Ordered in the ED  0.9 %  sodium chloride  infusion (has no administration in time range)  sodium chloride  0.9 % bolus 2,000 mL (has no administration in time range)  metoCLOPramide  (REGLAN ) injection 10 mg (has no administration in time range)                                    Medical Decision Making Amount and/or Complexity  of Data Reviewed Labs: ordered. Radiology: ordered.  Risk Prescription drug management.   The patient is EKG shows sinus tachycardia.  Given IV fluid bolus for this.  He is afebrile here.  COVID and flu test negative.  Urinalysis negative for infection.  Mild hyponatremia noted on metabolic panel.  Chest x-ray did not show any acute findings.  Due to patient's continued tachycardia concern for possible pulmonary  embolus and CT chest does show pneumonia.  Patient started on antibiotics.  Blood cultures and lactate obtained.  Will admit to the hospitalist team.  CRITICAL CARE Performed by: Curtistine ONEIDA Dawn Total critical care time: 50 minutes Critical care time was exclusive of separately billable procedures and treating other patients. Critical care was necessary to treat or prevent imminent or life-threatening deterioration. Critical care was time spent personally by me on the following activities: development of treatment plan with patient and/or surrogate as well as nursing, discussions with consultants, evaluation of patient's response to treatment, examination of patient, obtaining history from patient or surrogate, ordering and performing treatments and interventions, ordering and review of laboratory studies, ordering and review of radiographic studies, pulse oximetry and re-evaluation of patient's condition.      Final diagnoses:  None    ED Discharge Orders     None          Dawn Curtistine, MD 03/29/24 1939

## 2024-03-29 NOTE — Plan of Care (Signed)
 Plan of Care Note for accepted transfer   Patient: Charles Wolf MRN: [id number]   DOA: 03/29/2024  Facility requesting transfer: [facility] Requesting Provider: Dr. Dasie Reason for transfer: PNA Facility course:  58 yo male presenting with HA, SOB, ear ache, N/V/abd pain.  Found to be tachycardic, tachypneic.  CT A/P and CTH unremarkable. CTA chest negative for PE but shows L>R lower lobe infiltrate.  Afebrile and normal WBC. Despite 2L NS bolus, patient remained tachycardic and therefore being admitted for IV abx and further monitoring.  Rocephin  and Azithro ordered at [facility].    Plan of care: The patient is accepted for admission to Telemetry unit, at [facility].  Author: Alm Apo, MD 03/29/2024  Check [email or web address] for on-call coverage.  Nursing staff, Please call [facility] Admits & Consults System-Wide number on Amion as soon as patient's arrival, so appropriate admitting provider can evaluate the pt.

## 2024-03-29 NOTE — ED Triage Notes (Signed)
 Headache/migraine onset Saturday with abd pain, SOB, diarrhea, bilateral ear ache, nausea, and vomiting. Reports has not eaten in 6 days. PMH pancreatitis. Sent from [facility].

## 2024-03-30 DIAGNOSIS — R0982 Postnasal drip: Secondary | ICD-10-CM | POA: Diagnosis present

## 2024-03-30 DIAGNOSIS — Z716 Tobacco abuse counseling: Secondary | ICD-10-CM | POA: Diagnosis not present

## 2024-03-30 DIAGNOSIS — Z87442 Personal history of urinary calculi: Secondary | ICD-10-CM | POA: Diagnosis not present

## 2024-03-30 DIAGNOSIS — Z91048 Other nonmedicinal substance allergy status: Secondary | ICD-10-CM | POA: Diagnosis not present

## 2024-03-30 DIAGNOSIS — A419 Sepsis, unspecified organism: Secondary | ICD-10-CM | POA: Diagnosis present

## 2024-03-30 DIAGNOSIS — E871 Hypo-osmolality and hyponatremia: Secondary | ICD-10-CM | POA: Diagnosis present

## 2024-03-30 DIAGNOSIS — Z720 Tobacco use: Secondary | ICD-10-CM | POA: Insufficient documentation

## 2024-03-30 DIAGNOSIS — Z888 Allergy status to other drugs, medicaments and biological substances status: Secondary | ICD-10-CM | POA: Diagnosis not present

## 2024-03-30 DIAGNOSIS — Z8249 Family history of ischemic heart disease and other diseases of the circulatory system: Secondary | ICD-10-CM | POA: Diagnosis not present

## 2024-03-30 DIAGNOSIS — G43701 Chronic migraine without aura, not intractable, with status migrainosus: Secondary | ICD-10-CM

## 2024-03-30 DIAGNOSIS — K860 Alcohol-induced chronic pancreatitis: Secondary | ICD-10-CM | POA: Diagnosis not present

## 2024-03-30 DIAGNOSIS — Z1152 Encounter for screening for COVID-19: Secondary | ICD-10-CM | POA: Diagnosis not present

## 2024-03-30 DIAGNOSIS — Z833 Family history of diabetes mellitus: Secondary | ICD-10-CM | POA: Diagnosis not present

## 2024-03-30 DIAGNOSIS — R1110 Vomiting, unspecified: Secondary | ICD-10-CM | POA: Diagnosis not present

## 2024-03-30 DIAGNOSIS — J189 Pneumonia, unspecified organism: Secondary | ICD-10-CM | POA: Diagnosis present

## 2024-03-30 DIAGNOSIS — R5381 Other malaise: Secondary | ICD-10-CM | POA: Diagnosis present

## 2024-03-30 DIAGNOSIS — K5730 Diverticulosis of large intestine without perforation or abscess without bleeding: Secondary | ICD-10-CM | POA: Diagnosis present

## 2024-03-30 DIAGNOSIS — D649 Anemia, unspecified: Secondary | ICD-10-CM | POA: Diagnosis present

## 2024-03-30 DIAGNOSIS — K861 Other chronic pancreatitis: Secondary | ICD-10-CM | POA: Diagnosis present

## 2024-03-30 DIAGNOSIS — Z79899 Other long term (current) drug therapy: Secondary | ICD-10-CM | POA: Diagnosis not present

## 2024-03-30 DIAGNOSIS — I10 Essential (primary) hypertension: Secondary | ICD-10-CM | POA: Diagnosis present

## 2024-03-30 DIAGNOSIS — Z83438 Family history of other disorder of lipoprotein metabolism and other lipidemia: Secondary | ICD-10-CM | POA: Diagnosis not present

## 2024-03-30 DIAGNOSIS — F17210 Nicotine dependence, cigarettes, uncomplicated: Secondary | ICD-10-CM | POA: Diagnosis present

## 2024-03-30 DIAGNOSIS — R0682 Tachypnea, not elsewhere classified: Secondary | ICD-10-CM | POA: Diagnosis present

## 2024-03-30 DIAGNOSIS — E785 Hyperlipidemia, unspecified: Secondary | ICD-10-CM | POA: Diagnosis present

## 2024-03-30 DIAGNOSIS — E119 Type 2 diabetes mellitus without complications: Secondary | ICD-10-CM | POA: Diagnosis present

## 2024-03-30 DIAGNOSIS — R0902 Hypoxemia: Secondary | ICD-10-CM | POA: Diagnosis present

## 2024-03-30 LAB — GLUCOSE, CAPILLARY
Glucose-Capillary: 116 mg/dL — ABNORMAL HIGH (ref 70–99)
Glucose-Capillary: 95 mg/dL (ref 70–99)
Glucose-Capillary: 176 mg/dL — ABNORMAL HIGH (ref 70–99)

## 2024-03-30 LAB — CBC
HCT: 36 % — ABNORMAL LOW (ref 39.0–52.0)
Hemoglobin: 12.6 g/dL — ABNORMAL LOW (ref 13.0–17.0)
MCH: 33.5 pg (ref 26.0–34.0)
MCHC: 35 g/dL (ref 30.0–36.0)
MCV: 95.7 fL (ref 80.0–100.0)
Platelets: 123 K/uL — ABNORMAL LOW (ref 150–400)
RBC: 3.76 MIL/uL — ABNORMAL LOW (ref 4.22–5.81)
RDW: 12.2 % (ref 11.5–15.5)
WBC: 4.9 K/uL (ref 4.0–10.5)
nRBC: 0 % (ref 0.0–0.2)

## 2024-03-30 LAB — BASIC METABOLIC PANEL WITH GFR
Anion gap: 13 (ref 5–15)
BUN: 8 mg/dL (ref 6–20)
CO2: 18 mmol/L — ABNORMAL LOW (ref 22–32)
Calcium: 9.1 mg/dL (ref 8.9–10.3)
Chloride: 99 mmol/L (ref 98–111)
Creatinine, Ser: 0.92 mg/dL (ref 0.61–1.24)
GFR, Estimated: >60 mL/min (ref >60)
Glucose, Bld: 108 mg/dL — ABNORMAL HIGH (ref 70–99)
Potassium: 3.7 mmol/L (ref 3.5–5.1)
Sodium: 130 mmol/L — ABNORMAL LOW (ref 135–145)

## 2024-03-30 LAB — HIV ANTIBODY (ROUTINE TESTING W REFLEX): HIV Screen 4th Generation wRfx: NONREACTIVE

## 2024-03-30 MED ORDER — UBROGEPANT 100 MG PO TABS: 100.0000 mg | ORAL_TABLET | Freq: Every day | ORAL | Status: DC | PRN

## 2024-03-30 MED ORDER — VITAMIN B-12 1000 MCG PO TABS
1000.0000 ug | ORAL_TABLET | Freq: Every day | ORAL | Status: DC
  Administered 2024-03-30 – 2024-04-01 (×4): 1000 ug via ORAL
  Filled 2024-03-30 (×3): qty 1

## 2024-03-30 MED ORDER — INSULIN ASPART 100 UNIT/ML IJ SOLN: 0.0000 [IU] | Freq: Every day | INTRAMUSCULAR | Status: DC

## 2024-03-30 MED ORDER — INSULIN ASPART 100 UNIT/ML IJ SOLN: 0.0000 [IU] | Freq: Three times a day (TID) | INTRAMUSCULAR | Status: DC

## 2024-03-30 MED ORDER — MECLIZINE HCL 25 MG PO TABS: 25.0000 mg | ORAL_TABLET | Freq: Two times a day (BID) | ORAL | Status: DC | PRN

## 2024-03-30 MED ORDER — PANCRELIPASE (LIP-PROT-AMYL) 36000-114000 UNITS PO CPEP: 36000.0000 [IU] | ORAL_CAPSULE | Freq: Four times a day (QID) | ORAL | Status: DC

## 2024-03-30 MED ORDER — BUTALBITAL-APAP-CAFFEINE 50-325-40 MG PO TABS
1.0000 | ORAL_TABLET | ORAL | Status: DC | PRN
  Administered 2024-03-30: 1 via ORAL
  Filled 2024-03-30: qty 1

## 2024-03-30 MED ORDER — PANCRELIPASE (LIP-PROT-AMYL) 36000-114000 UNITS PO CPEP
36000.0000 [IU] | ORAL_CAPSULE | Freq: Three times a day (TID) | ORAL | Status: DC
  Administered 2024-03-30 – 2024-04-01 (×6): 36000 [IU] via ORAL
  Filled 2024-03-30 (×5): qty 1

## 2024-03-30 MED ORDER — PANCRELIPASE (LIP-PROT-AMYL) 36000-114000 UNITS PO CPEP
36000.0000 [IU] | ORAL_CAPSULE | Freq: Every evening | ORAL | Status: DC
  Administered 2024-03-30 – 2024-03-31 (×2): 36000 [IU] via ORAL
  Filled 2024-03-30 (×2): qty 1

## 2024-03-30 NOTE — Progress Notes (Signed)
 Progress Note   Patient: Charles Wolf FMW:[id number] DOB: [birth year] DOA: 03/29/2024     0 DOS: the patient was seen and examined on 03/30/2024   Brief hospital course: Charles Wolf is a 58 y.o. male with medical history significant for hypertension, hyperlipidemia, diet-controlled diabetes mellitus, chronic pancreatitis, and chronic migraine who presents with approximately 1 week of general aches, malaise, nausea, vomiting, diarrhea, and worsening cough and shortness of breath.   Patient is found to be febrile 102.8, hypoxic, 90s on room air with tachypnea, tachycardia.  Labs are most notable for sodium 128, CTA chest is negative for PE but concerning for pneumonia. He is admitted to [facility] service for further management and evaluation of fever, diarrhea, pneumonia.   Assessment and Plan: Sepsis Left lower lobe pneumonia- Presented with fever, hypoxia, tachypnea, tachycardia. Continue rocephin , Azithromycin . COVID/ flu negative. 20 path respiratory panel ordered. Wean supplemental oxygen as tolerated. Continue bronchodilators, antitussive  medications.  Nausea/ vomiting/ diarrhea- Gi panel ordered. Seems he is on creon  therapy for pancreatic insufficiency. Continue supportive care. He did receive IV fluids, tolerating diet now IV zofran  prn for nausea. Encourage oral diet, supplements.  Hyponatremia- Patient states this is chronic. Na 130 today. Continue to trend. Off fluids now. Encourage oral diet.  Type 2 diabetes- Diet controlled. Check A1c. Accucheks, sliding scale as per protocol.  Hypertension- Resumed Losartan  home dose.  Migraine headaches- Fioricet  PRN ordered. Recenlty started on Ubrelvy  by neurologist.  Hyperlipidemia- on statin.  Tobacco abuse- States he is cutting down and is on 1/2 ppd. Advised to quit smoking. Nicotine patch offered.      Out of bed to chair. Incentive spirometry. Nursing supportive care. Fall, aspiration precautions. Diet:  Diet  Orders (From admission, onward)     Start     Ordered   03/30/24 1011  Diet heart healthy/carb modified Room service appropriate? Yes; Fluid consistency: Thin  Diet effective now       Question Answer Comment  Diet-HS Snack? Nothing   Room service appropriate? Yes   Fluid consistency: Thin      03/30/24 1011           DVT prophylaxis: enoxaparin  (LOVENOX ) injection 40 mg Start: 03/30/24 1000  Level of care: Telemetry Medical   Code Status: Full Code  Subjective: Patient is seen and examined today morning. Feels weak, has cough. Does have diarrhea. Nausea better. His appetite improved. Tmax 102.8. advised out of bed to chair.  Physical Exam: Vitals:   03/29/24 2301 03/29/24 2347 03/30/24 0348 03/30/24 0850  BP:  127/75 127/84 (!) 142/88  Pulse:  (!) 123 94 96  Resp:  20 20 17   Temp:  (!) 101.1 F (38.4 C) (!) 97.5 F (36.4 C) 97.8 F (36.6 C)  TempSrc:  Oral Oral   SpO2: 95% 95% 100% 97%  Weight:      Height:        General - Elderly Caucasian ill male, distress due to headache, cough HEENT - PERRLA, EOMI, atraumatic head, non tender sinuses. Lung - Clear, basal rales, rhonchi, no wheezes. Heart - S1, S2 heard, no murmurs, rubs, trace pedal edema. Abdomen - Soft, non tender, bowel sounds good Neuro - Alert, awake and oriented x 3, non focal exam. Skin - Warm and dry.  Data Reviewed:      Latest Ref Rng & Units 03/30/2024    9:20 AM 03/29/2024    5:10 PM 12/12/2023    1:10 PM  CBC  WBC 4.0 - 10.5 K/uL 4.9  4.2  4.4   Hemoglobin 13.0 - 17.0 g/dL 87.3  84.2  85.0   Hematocrit 39.0 - 52.0 % 36.0  42.8  43.1   Platelets 150 - 400 K/uL 123  132  208       Latest Ref Rng & Units 03/30/2024    9:20 AM 03/29/2024    5:10 PM 12/12/2023    1:10 PM  BMP  Glucose 70 - 99 mg/dL 891  833  858   BUN 6 - 20 mg/dL 8  15  9    Creatinine 0.61 - 1.24 mg/dL 9.07  8.76  8.87   Sodium 135 - 145 mmol/L 130  128  133   Potassium 3.5 - 5.1 mmol/L 3.7  3.7  4.0   Chloride 98 - 111  mmol/L 99  88  98   CO2 22 - 32 mmol/L 18  20  22    Calcium 8.9 - 10.3 mg/dL 9.1  89.0  9.9    CT Angio Chest PE W/Cm &/Or Wo Cm Result Date: 03/29/2024 CLINICAL DATA:  Pulmonary embolism (PE) suspected, high prob EXAM: CT ANGIOGRAPHY CHEST WITH CONTRAST TECHNIQUE: Multidetector CT imaging of the chest was performed using the standard protocol during bolus administration of intravenous contrast. Multiplanar CT image reconstructions and MIPs were obtained to evaluate the vascular anatomy. RADIATION DOSE REDUCTION: This exam was performed according to the departmental dose-optimization program which includes automated exposure control, adjustment of the mA and/or kV according to patient size and/or use of iterative reconstruction technique. CONTRAST:  OMNIPAQUE  IOHEXOL  350 MG/ML SOLN COMPARISON:  Dec 22, 2023 FINDINGS: Pulmonary Embolism: No pulmonary embolism. Cardiovascular: No cardiomegaly or pericardial effusion.Multi-vessel coronary atherosclerosis.No aortic aneurysm. Mediastinum/Nodes: No mediastinal mass. No mediastinal, hilar, or axillary lymphadenopathy. Lungs/Pleura: The midline trachea and bronchi are patent. Scattered tree-in-bud nodularity noted throughout the right lung. More dense consolidation in the basilar segments of the left lower lobe. Scattered tree-in-bud nodularity is also noted throughout the left upper and left lower lobes. No pleural effusion or pneumothorax. Musculoskeletal: No acute fracture or destructive bone lesion. Multilevel degenerative disc disease of the spine. Upper Abdomen: For findings below the diaphragm, please see the separately dictated CT of the abdomen and pelvis report, which was performed concurrently. Review of the MIP images confirms the above findings. IMPRESSION: 1. Left lower lobe pneumonia with endobronchial spread of infection throughout both the left and right lungs. No pleural effusion. A 2 view chest radiograph in 6-12 weeks is recommended to document  resolution once treatment is complete. 2. No pulmonary embolism. 3. For findings below the diaphragm, please see the separately dictated CT of the abdomen and pelvis report, which was performed concurrently. Aortic Atherosclerosis (ICD10-I70.0). Electronically Signed   By: Rogelia Myers M.D.   On: 03/29/2024 19:27   CT ABDOMEN PELVIS W CONTRAST Result Date: 03/29/2024 CLINICAL DATA:  Abdominal pain, acute, nonlocalized EXAM: CT ABDOMEN AND PELVIS WITH CONTRAST TECHNIQUE: Multidetector CT imaging of the abdomen and pelvis was performed using the standard protocol following bolus administration of intravenous contrast. RADIATION DOSE REDUCTION: This exam was performed according to the departmental dose-optimization program which includes automated exposure control, adjustment of the mA and/or kV according to patient size and/or use of iterative reconstruction technique. CONTRAST:  OMNIPAQUE  IOHEXOL  350 MG/ML SOLN COMPARISON:  February 09, 2022 FINDINGS: Lower chest: For findings above the diaphragm, please see the separately dictated CT of the chest report, which was performed concurrently. Hepatobiliary: No mass.Diffuse hepatic steatosis.Decompressed gallbladder without radiopaque stones or  wall thickening. No intrahepatic or extrahepatic biliary ductal dilation. The portal veins are patent. Pancreas: No mass or main ductal dilation. No peripancreatic inflammation or fluid collection. Spleen: Normal size. No mass. Adrenals/Urinary Tract: No adrenal masses. No renal mass. Punctate nonobstructive left upper pole calculus. No hydronephrosis. Circumferential wall thickening of the urinary bladder. Stomach/Bowel: Small hiatal hernia. The stomach is decompressed without focal abnormality. No small bowel wall thickening or inflammation. No small bowel obstruction.Normal appendix. Descending and sigmoid colonic diverticulosis. No changes of acute diverticulitis. Vascular/Lymphatic: No aortic aneurysm. Diffuse  aortoiliac atherosclerosis. No intraabdominal or pelvic lymphadenopathy. Reproductive: No prostatomegaly.No free pelvic fluid. Other: No pneumoperitoneum, ascites, or mesenteric inflammation. Musculoskeletal: No acute fracture or destructive lesion. Multilevel degenerative disc disease of the spine. IMPRESSION: 1. Circumferential wall thickening of the urinary bladder, which may be due to underdistension. If there is concern for acute cystitis, correlation with urinalysis would be recommended. 2. Punctate nonobstructive left upper pole calyceal calculus. No hydronephrosis. 3. Descending and sigmoid colonic diverticulosis. No changes of acute diverticulitis. 4. Diffuse hepatic steatosis. Aortic Atherosclerosis (ICD10-I70.0). Electronically Signed   By: Rogelia Myers M.D.   On: 03/29/2024 19:21   CT Head Wo Contrast Result Date: 03/29/2024 CLINICAL DATA:  Migraine headache for several days, initial encounter EXAM: CT HEAD WITHOUT CONTRAST TECHNIQUE: Contiguous axial images were obtained from the base of the skull through the vertex without intravenous contrast. RADIATION DOSE REDUCTION: This exam was performed according to the departmental dose-optimization program which includes automated exposure control, adjustment of the mA and/or kV according to patient size and/or use of iterative reconstruction technique. COMPARISON:  12/12/2023 FINDINGS: Brain: No evidence of acute infarction, hemorrhage, hydrocephalus, extra-axial collection or mass lesion/mass effect. Mild basal ganglia calcifications are noted Vascular: No hyperdense vessel or unexpected calcification. Skull: Normal. Negative for fracture or focal lesion. Sinuses/Orbits: Mucosal thickening is noted within the left maxillary and sphenoid sinuses progressed in the interval from the prior exam. Other: None IMPRESSION: No acute intracranial abnormality noted. Mucosal thickening in the paranasal sinuses. Electronically Signed   By: Oneil Devonshire M.D.   On:  03/29/2024 19:12   DG Chest Port 1 View Result Date: 03/29/2024 CLINICAL DATA:  Cough. EXAM: PORTABLE CHEST 1 VIEW COMPARISON:  November 15, 2023. FINDINGS: The heart size and mediastinal contours are within normal limits. Both lungs are clear. The visualized skeletal structures are unremarkable. IMPRESSION: No active disease. Electronically Signed   By: Lynwood Landy Raddle M.D.   On: 03/29/2024 16:50    Family Communication: Discussed with patient, wife. They understand and agree. All questions answered.  Disposition: Status is: Observation The patient will require care spanning > 2 midnights and should be moved to inpatient because: febrile, cough, weak, eating poor, diarrhea for 1 week  Planned Discharge Destination: Home with Home Health     Time spent: 45 minutes  Author: Concepcion Riser, MD 03/30/2024 1:23 PM Secure chat 7am to 7pm For on call review [email or web address].

## 2024-03-30 NOTE — Plan of Care (Signed)
  Problem: Education: Goal: Knowledge of General Education information will improve Description: Including pain rating scale, medication(s)/side effects and non-pharmacologic comfort measures 03/30/2024 0016 by Evern Monica HERO, RN Outcome: Progressing 03/30/2024 0015 by Evern Monica HERO, RN Outcome: Not Progressing   Problem: Health Behavior/Discharge Planning: Goal: Ability to manage health-related needs will improve 03/30/2024 0016 by Evern Monica HERO, RN Outcome: Progressing 03/30/2024 0015 by Evern Monica HERO, RN Outcome: Not Progressing   Problem: Clinical Measurements: Goal: Ability to maintain clinical measurements within normal limits will improve 03/30/2024 0016 by Evern Monica HERO, RN Outcome: Progressing 03/30/2024 0015 by Evern Monica HERO, RN Outcome: Not Progressing Goal: Will remain free from infection 03/30/2024 0016 by Evern Monica HERO, RN Outcome: Progressing 03/30/2024 0015 by Evern Monica HERO, RN Outcome: Not Progressing Goal: Diagnostic test results will improve 03/30/2024 0016 by Evern Monica HERO, RN Outcome: Progressing 03/30/2024 0015 by Evern Monica HERO, RN Outcome: Not Progressing Goal: Respiratory complications will improve 03/30/2024 0016 by Evern Monica HERO, RN Outcome: Progressing 03/30/2024 0015 by Evern Monica HERO, RN Outcome: Not Progressing Goal: Cardiovascular complication will be avoided 03/30/2024 0016 by Evern Monica HERO, RN Outcome: Progressing 03/30/2024 0015 by Evern Monica HERO, RN Outcome: Not Progressing   Problem: Activity: Goal: Risk for activity intolerance will decrease 03/30/2024 0016 by Evern Monica HERO, RN Outcome: Progressing 03/30/2024 0015 by Evern Monica HERO, RN Outcome: Not Progressing   Problem: Nutrition: Goal: Adequate nutrition will be maintained 03/30/2024 0016 by Evern Monica HERO, RN Outcome: Progressing 03/30/2024 0015 by Evern Monica HERO, RN Outcome: Not Progressing   Problem: Coping: Goal: Level of anxiety will  decrease 03/30/2024 0016 by Evern Monica HERO, RN Outcome: Progressing 03/30/2024 0015 by Evern Monica HERO, RN Outcome: Not Progressing   Problem: Elimination: Goal: Will not experience complications related to bowel motility 03/30/2024 0016 by Evern Monica HERO, RN Outcome: Progressing 03/30/2024 0015 by Evern Monica HERO, RN Outcome: Not Progressing Goal: Will not experience complications related to urinary retention 03/30/2024 0016 by Evern Monica HERO, RN Outcome: Progressing 03/30/2024 0015 by Evern Monica HERO, RN Outcome: Not Progressing   Problem: Pain Managment: Goal: General experience of comfort will improve and/or be controlled 03/30/2024 0016 by Evern Monica HERO, RN Outcome: Progressing 03/30/2024 0015 by Evern Monica HERO, RN Outcome: Not Progressing   Problem: Safety: Goal: Ability to remain free from injury will improve 03/30/2024 0016 by Evern Monica HERO, RN Outcome: Progressing 03/30/2024 0015 by Evern Monica HERO, RN Outcome: Not Progressing   Problem: Skin Integrity: Goal: Risk for impaired skin integrity will decrease 03/30/2024 0016 by Evern Monica HERO, RN Outcome: Progressing 03/30/2024 0015 by Evern Monica HERO, RN Outcome: Not Progressing   Problem: Activity: Goal: Ability to tolerate increased activity will improve 03/30/2024 0016 by Evern Monica HERO, RN Outcome: Progressing 03/30/2024 0015 by Evern Monica HERO, RN Outcome: Not Progressing   Problem: Clinical Measurements: Goal: Ability to maintain a body temperature in the normal range will improve 03/30/2024 0016 by Evern Monica HERO, RN Outcome: Progressing 03/30/2024 0015 by Evern Monica HERO, RN Outcome: Not Progressing   Problem: Respiratory: Goal: Ability to maintain adequate ventilation will improve 03/30/2024 0016 by Evern Monica HERO, RN Outcome: Progressing 03/30/2024 0015 by Evern Monica HERO, RN Outcome: Not Progressing Goal: Ability to maintain a clear airway will improve 03/30/2024 0016 by  Evern Monica HERO, RN Outcome: Progressing 03/30/2024 0015 by Evern Monica HERO, RN Outcome: Not Progressing

## 2024-03-30 NOTE — Plan of Care (Signed)

## 2024-03-31 DIAGNOSIS — J189 Pneumonia, unspecified organism: Secondary | ICD-10-CM | POA: Diagnosis not present

## 2024-03-31 LAB — BASIC METABOLIC PANEL WITH GFR
Anion gap: 12 (ref 5–15)
BUN: 10 mg/dL (ref 6–20)
CO2: 20 mmol/L — ABNORMAL LOW (ref 22–32)
Calcium: 9.2 mg/dL (ref 8.9–10.3)
Chloride: 99 mmol/L (ref 98–111)
Creatinine, Ser: 0.92 mg/dL (ref 0.61–1.24)
GFR, Estimated: >60 mL/min (ref >60)
Glucose, Bld: 89 mg/dL (ref 70–99)
Potassium: 3.1 mmol/L — ABNORMAL LOW (ref 3.5–5.1)
Sodium: 131 mmol/L — ABNORMAL LOW (ref 135–145)

## 2024-03-31 LAB — GLUCOSE, CAPILLARY
Glucose-Capillary: 88 mg/dL (ref 70–99)
Glucose-Capillary: 124 mg/dL — ABNORMAL HIGH (ref 70–99)
Glucose-Capillary: 139 mg/dL — ABNORMAL HIGH (ref 70–99)
Glucose-Capillary: 160 mg/dL — ABNORMAL HIGH (ref 70–99)

## 2024-03-31 LAB — CBC
HCT: 32.9 % — ABNORMAL LOW (ref 39.0–52.0)
Hemoglobin: 11.8 g/dL — ABNORMAL LOW (ref 13.0–17.0)
MCH: 33.1 pg (ref 26.0–34.0)
MCHC: 35.9 g/dL (ref 30.0–36.0)
MCV: 92.2 fL (ref 80.0–100.0)
Platelets: 138 K/uL — ABNORMAL LOW (ref 150–400)
RBC: 3.57 MIL/uL — ABNORMAL LOW (ref 4.22–5.81)
RDW: 12 % (ref 11.5–15.5)
WBC: 4.8 K/uL (ref 4.0–10.5)
nRBC: 0 % (ref 0.0–0.2)

## 2024-03-31 LAB — MAGNESIUM: Magnesium: 1.6 mg/dL — ABNORMAL LOW (ref 1.7–2.4)

## 2024-03-31 MED ORDER — POTASSIUM CHLORIDE CRYS ER 20 MEQ PO TBCR
40.0000 meq | EXTENDED_RELEASE_TABLET | ORAL | Status: AC
  Administered 2024-03-31 (×2): 40 meq via ORAL
  Filled 2024-03-31 (×2): qty 2

## 2024-03-31 NOTE — Plan of Care (Signed)
   Problem: Education: Goal: Knowledge of General Education information will improve Description: Including pain rating scale, medication(s)/side effects and non-pharmacologic comfort measures Outcome: Progressing   Problem: Health Behavior/Discharge Planning: Goal: Ability to manage health-related needs will improve Outcome: Progressing   Problem: Clinical Measurements: Goal: Will remain free from infection Outcome: Progressing

## 2024-03-31 NOTE — Progress Notes (Signed)
 Progress Note   Patient: Charles Wolf FMW:[id number] DOB: 09-21-1965 DOA: 03/29/2024     1 DOS: the patient was seen and examined on 03/31/2024   Brief hospital course: As per prior documentation: Charles Wolf is a 58 y.o. male with medical history significant for hypertension, hyperlipidemia, diet-controlled diabetes mellitus, chronic pancreatitis, and chronic migraine who presents with approximately 1 week of general aches, malaise, nausea, vomiting, diarrhea, and worsening cough and shortness of breath.   Patient is found to be febrile 102.8, hypoxic, 90s on room air with tachypnea, tachycardia.  Labs are most notable for sodium 128, CTA chest is negative for PE but concerning for pneumonia. He is admitted to [facility] service for further management and evaluation of fever, diarrhea, pneumonia.  03/31/2024: Patient seen.  No documented fever overnight.  Blood pressure control is improving.  O2 sat ranges from 97 to 100%.  No leukocytosis.  Mild anemia is noted, with hemoglobin of 11.8 g/dL.  Platelets of 138 noted.  Potassium of 3.1.  Will replete.  Blood cultures have not grown any organisms today.   Assessment and Plan: Sepsis: Left lower lobe pneumonia: Presented with fever, hypoxia, tachypnea, tachycardia. Continue rocephin , Azithromycin . COVID/ flu negative. 20 path respiratory panel ordered. Wean supplemental oxygen as tolerated. Continue bronchodilators, antitussive  medications. 03/31/2024: Sepsis physiology has resolved.  Patient is on IV azithromycin  and ceftriaxone .  Continue antibiotics for now.  Nausea/ vomiting/ diarrhea- Gi panel ordered. Seems he is on creon  therapy for pancreatic insufficiency. Continue supportive care. He did receive IV fluids, tolerating diet now IV zofran  prn for nausea. Encourage oral diet, supplements. 03/31/2024: GI symptoms have resolved significantly.  Hyponatremia- Patient states this is chronic. Na 130 today. Continue to trend. Off fluids  now. Encourage oral diet. 03/31/2024: Hyponatremia is chronic.  Type 2 diabetes- Diet controlled. Check A1c. Accucheks, sliding scale as per protocol.  Hypertension: Resumed Losartan  home dose. -Controlled.  Migraine headaches- Fioricet  PRN ordered. Recenlty started on Ubrelvy  by neurologist.  Hyperlipidemia- on statin.  Tobacco abuse- States he is cutting down and is on 1/2 ppd. Advised to quit smoking. Nicotine patch offered.      Out of bed to chair. Incentive spirometry. Nursing supportive care. Fall, aspiration precautions. Diet:  Diet Orders (From admission, onward)     Start     Ordered   03/30/24 1011  Diet heart healthy/carb modified Room service appropriate? Yes; Fluid consistency: Thin  Diet effective now       Question Answer Comment  Diet-HS Snack? Nothing   Room service appropriate? Yes   Fluid consistency: Thin      03/30/24 1011           DVT prophylaxis: enoxaparin  (LOVENOX ) injection 40 mg Start: 03/30/24 1000  Level of care: Telemetry Medical   Code Status: Full Code  Subjective: - Patient seen. - No new complaints. - Patient continues to cough.    Physical Exam: Vitals:   03/31/24 0827 03/31/24 0828 03/31/24 0831 03/31/24 1224  BP: (!) 170/94 (!) 158/86  134/85  Pulse: 86 85 87 89  Resp:   18 18  Temp: 98.4 F (36.9 C)   98.3 F (36.8 C)  TempSrc:    Oral  SpO2: 100%  99% 100%  Weight:      Height:        General -not in any distress.  Awake and alert.   HEENT -mild pallor.   Lung -clear to auscultation.   Heart - S1, S2. Abdomen - Soft, non tender, bowel  sounds good Neuro -awake and alert.  Data Reviewed:      Latest Ref Rng & Units 03/31/2024    5:57 AM 03/30/2024    9:20 AM 03/29/2024    5:10 PM  CBC  WBC 4.0 - 10.5 K/uL 4.8  4.9  4.2   Hemoglobin 13.0 - 17.0 g/dL 88.1  87.3  84.2   Hematocrit 39.0 - 52.0 % 32.9  36.0  42.8   Platelets 150 - 400 K/uL 138  123  132       Latest Ref Rng & Units 03/31/2024     5:57 AM 03/30/2024    9:20 AM 03/29/2024    5:10 PM  BMP  Glucose 70 - 99 mg/dL 89  891  833   BUN 6 - 20 mg/dL 10  8  15    Creatinine 0.61 - 1.24 mg/dL 9.07  9.07  8.76   Sodium 135 - 145 mmol/L 131  130  128   Potassium 3.5 - 5.1 mmol/L 3.1  3.7  3.7   Chloride 98 - 111 mmol/L 99  99  88   CO2 22 - 32 mmol/L 20  18  20    Calcium 8.9 - 10.3 mg/dL 9.2  9.1  89.0    CT Angio Chest PE W/Cm &/Or Wo Cm Result Date: 03/29/2024 CLINICAL DATA:  Pulmonary embolism (PE) suspected, high prob EXAM: CT ANGIOGRAPHY CHEST WITH CONTRAST TECHNIQUE: Multidetector CT imaging of the chest was performed using the standard protocol during bolus administration of intravenous contrast. Multiplanar CT image reconstructions and MIPs were obtained to evaluate the vascular anatomy. RADIATION DOSE REDUCTION: This exam was performed according to the departmental dose-optimization program which includes automated exposure control, adjustment of the mA and/or kV according to patient size and/or use of iterative reconstruction technique. CONTRAST:  OMNIPAQUE  IOHEXOL  350 MG/ML SOLN COMPARISON:  Dec 22, 2023 FINDINGS: Pulmonary Embolism: No pulmonary embolism. Cardiovascular: No cardiomegaly or pericardial effusion.Multi-vessel coronary atherosclerosis.No aortic aneurysm. Mediastinum/Nodes: No mediastinal mass. No mediastinal, hilar, or axillary lymphadenopathy. Lungs/Pleura: The midline trachea and bronchi are patent. Scattered tree-in-bud nodularity noted throughout the right lung. More dense consolidation in the basilar segments of the left lower lobe. Scattered tree-in-bud nodularity is also noted throughout the left upper and left lower lobes. No pleural effusion or pneumothorax. Musculoskeletal: No acute fracture or destructive bone lesion. Multilevel degenerative disc disease of the spine. Upper Abdomen: For findings below the diaphragm, please see the separately dictated CT of the abdomen and pelvis report, which was performed  concurrently. Review of the MIP images confirms the above findings. IMPRESSION: 1. Left lower lobe pneumonia with endobronchial spread of infection throughout both the left and right lungs. No pleural effusion. A 2 view chest radiograph in 6-12 weeks is recommended to document resolution once treatment is complete. 2. No pulmonary embolism. 3. For findings below the diaphragm, please see the separately dictated CT of the abdomen and pelvis report, which was performed concurrently. Aortic Atherosclerosis (ICD10-I70.0). Electronically Signed   By: Rogelia Myers M.D.   On: 03/29/2024 19:27   CT ABDOMEN PELVIS W CONTRAST Result Date: 03/29/2024 CLINICAL DATA:  Abdominal pain, acute, nonlocalized EXAM: CT ABDOMEN AND PELVIS WITH CONTRAST TECHNIQUE: Multidetector CT imaging of the abdomen and pelvis was performed using the standard protocol following bolus administration of intravenous contrast. RADIATION DOSE REDUCTION: This exam was performed according to the departmental dose-optimization program which includes automated exposure control, adjustment of the mA and/or kV according to patient size and/or use of iterative  reconstruction technique. CONTRAST:  OMNIPAQUE  IOHEXOL  350 MG/ML SOLN COMPARISON:  February 09, 2022 FINDINGS: Lower chest: For findings above the diaphragm, please see the separately dictated CT of the chest report, which was performed concurrently. Hepatobiliary: No mass.Diffuse hepatic steatosis.Decompressed gallbladder without radiopaque stones or wall thickening. No intrahepatic or extrahepatic biliary ductal dilation. The portal veins are patent. Pancreas: No mass or main ductal dilation. No peripancreatic inflammation or fluid collection. Spleen: Normal size. No mass. Adrenals/Urinary Tract: No adrenal masses. No renal mass. Punctate nonobstructive left upper pole calculus. No hydronephrosis. Circumferential wall thickening of the urinary bladder. Stomach/Bowel: Small hiatal hernia. The  stomach is decompressed without focal abnormality. No small bowel wall thickening or inflammation. No small bowel obstruction.Normal appendix. Descending and sigmoid colonic diverticulosis. No changes of acute diverticulitis. Vascular/Lymphatic: No aortic aneurysm. Diffuse aortoiliac atherosclerosis. No intraabdominal or pelvic lymphadenopathy. Reproductive: No prostatomegaly.No free pelvic fluid. Other: No pneumoperitoneum, ascites, or mesenteric inflammation. Musculoskeletal: No acute fracture or destructive lesion. Multilevel degenerative disc disease of the spine. IMPRESSION: 1. Circumferential wall thickening of the urinary bladder, which may be due to underdistension. If there is concern for acute cystitis, correlation with urinalysis would be recommended. 2. Punctate nonobstructive left upper pole calyceal calculus. No hydronephrosis. 3. Descending and sigmoid colonic diverticulosis. No changes of acute diverticulitis. 4. Diffuse hepatic steatosis. Aortic Atherosclerosis (ICD10-I70.0). Electronically Signed   By: Rogelia Myers M.D.   On: 03/29/2024 19:21   CT Head Wo Contrast Result Date: 03/29/2024 CLINICAL DATA:  Migraine headache for several days, initial encounter EXAM: CT HEAD WITHOUT CONTRAST TECHNIQUE: Contiguous axial images were obtained from the base of the skull through the vertex without intravenous contrast. RADIATION DOSE REDUCTION: This exam was performed according to the departmental dose-optimization program which includes automated exposure control, adjustment of the mA and/or kV according to patient size and/or use of iterative reconstruction technique. COMPARISON:  12/12/2023 FINDINGS: Brain: No evidence of acute infarction, hemorrhage, hydrocephalus, extra-axial collection or mass lesion/mass effect. Mild basal ganglia calcifications are noted Vascular: No hyperdense vessel or unexpected calcification. Skull: Normal. Negative for fracture or focal lesion. Sinuses/Orbits: Mucosal  thickening is noted within the left maxillary and sphenoid sinuses progressed in the interval from the prior exam. Other: None IMPRESSION: No acute intracranial abnormality noted. Mucosal thickening in the paranasal sinuses. Electronically Signed   By: Oneil Devonshire M.D.   On: 03/29/2024 19:12   DG Chest Port 1 View Result Date: 03/29/2024 CLINICAL DATA:  Cough. EXAM: PORTABLE CHEST 1 VIEW COMPARISON:  November 15, 2023. FINDINGS: The heart size and mediastinal contours are within normal limits. Both lungs are clear. The visualized skeletal structures are unremarkable. IMPRESSION: No active disease. Electronically Signed   By: Lynwood Landy Raddle M.D.   On: 03/29/2024 16:50    Family Communication: Discussed with patient, wife. They understand and agree. All questions answered.  Disposition: Status is: Observation The patient will require care spanning > 2 midnights and should be moved to inpatient because: febrile, cough, weak, eating poor, diarrhea for 1 week  Planned Discharge Destination: Home with Home Health     Time spent: 35 minutes  Author: Leatrice LILLETTE Chapel, MD 03/31/2024 1:32 PM Secure chat 7am to 7pm For on call review [email or web address].

## 2024-03-31 NOTE — Plan of Care (Signed)
  Problem: Education: Goal: Knowledge of General Education information will improve Description: Including pain rating scale, medication(s)/side effects and non-pharmacologic comfort measures Outcome: Progressing   Problem: Clinical Measurements: Goal: Will remain free from infection Outcome: Progressing Goal: Respiratory complications will improve Outcome: Progressing Goal: Cardiovascular complication will be avoided Outcome: Progressing   Problem: Activity: Goal: Risk for activity intolerance will decrease Outcome: Progressing   Problem: Nutrition: Goal: Adequate nutrition will be maintained Outcome: Progressing   

## 2024-04-01 DIAGNOSIS — K860 Alcohol-induced chronic pancreatitis: Secondary | ICD-10-CM

## 2024-04-01 DIAGNOSIS — F17210 Nicotine dependence, cigarettes, uncomplicated: Secondary | ICD-10-CM | POA: Diagnosis not present

## 2024-04-01 DIAGNOSIS — J189 Pneumonia, unspecified organism: Secondary | ICD-10-CM | POA: Diagnosis not present

## 2024-04-01 DIAGNOSIS — G43701 Chronic migraine without aura, not intractable, with status migrainosus: Secondary | ICD-10-CM | POA: Diagnosis not present

## 2024-04-01 DIAGNOSIS — E871 Hypo-osmolality and hyponatremia: Secondary | ICD-10-CM | POA: Diagnosis not present

## 2024-04-01 LAB — BASIC METABOLIC PANEL WITH GFR
Anion gap: 11 (ref 5–15)
BUN: 11 mg/dL (ref 6–20)
CO2: 22 mmol/L (ref 22–32)
Calcium: 9.4 mg/dL (ref 8.9–10.3)
Chloride: 98 mmol/L (ref 98–111)
Creatinine, Ser: 0.93 mg/dL (ref 0.61–1.24)
GFR, Estimated: >60 mL/min (ref >60)
Glucose, Bld: 110 mg/dL — ABNORMAL HIGH (ref 70–99)
Potassium: 3.5 mmol/L (ref 3.5–5.1)
Sodium: 131 mmol/L — ABNORMAL LOW (ref 135–145)

## 2024-04-01 LAB — CBC
HCT: 34.3 % — ABNORMAL LOW (ref 39.0–52.0)
Hemoglobin: 12.6 g/dL — ABNORMAL LOW (ref 13.0–17.0)
MCH: 33.6 pg (ref 26.0–34.0)
MCHC: 36.7 g/dL — ABNORMAL HIGH (ref 30.0–36.0)
MCV: 91.5 fL (ref 80.0–100.0)
Platelets: 198 K/uL (ref 150–400)
RBC: 3.75 MIL/uL — ABNORMAL LOW (ref 4.22–5.81)
RDW: 12.1 % (ref 11.5–15.5)
WBC: 5.5 K/uL (ref 4.0–10.5)
nRBC: 0 % (ref 0.0–0.2)

## 2024-04-01 LAB — GLUCOSE, CAPILLARY: Glucose-Capillary: 169 mg/dL — ABNORMAL HIGH (ref 70–99)

## 2024-04-01 LAB — HEMOGLOBIN A1C
Hgb A1c MFr Bld: 5.8 % — ABNORMAL HIGH (ref 4.8–5.6)
Mean Plasma Glucose: 120 mg/dL

## 2024-04-01 MED ORDER — AMOXICILLIN-POT CLAVULANATE 875-125 MG PO TABS: 1.0000 | ORAL_TABLET | Freq: Two times a day (BID) | ORAL | 0 refills | Status: AC

## 2024-04-01 NOTE — Discharge Summary (Signed)
 Physician Discharge Summary   Patient: Charles Wolf MRN: [id number] DOB: 10-Jun-1966  Admit date:     03/29/2024  Discharge date: {dischdate:26783}  Discharge Physician: Concepcion Riser   PCP: Dyane Anthony RAMAN, FNP   Recommendations at discharge:  {Tip this will not be part of the note when signed- Example include specific recommendations for outpatient follow-up, pending tests to follow-up on. (Optional):26781}  ***  Discharge Diagnoses: Principal Problem:   Left lower lobe pneumonia Active Problems:   Chronic migraine without aura with status migrainosus, not intractable   Hyponatremia   Vomiting and diarrhea   Chronic pancreatitis (HCC)   Hypertension   Sepsis (HCC)   Tobacco abuse  Resolved Problems:   * No resolved hospital problems. [facility] Course: No notes on file  Assessment and Plan: No notes have been filed under this hospital service. Service: Hospitalist     {Tip this will not be part of the note when signed Body mass index is 24.94 kg/m. , ,  (Optional):26781}  {(NOTE) Pain control PDMP Statment (Optional):26782} Consultants: *** Procedures performed: ***  Disposition: {Plan; Disposition:26390} Diet recommendation:  Discharge Diet Orders (From admission, onward)     Start     Ordered   04/01/24 0000  Diet - low sodium heart healthy        04/01/24 1047   04/01/24 0000  Diet Carb Modified        04/01/24 1047           {Diet_Plan:26776} DISCHARGE MEDICATION: Allergies as of 04/01/2024       Reactions   Capsaicin Other (See Comments)   Other Reaction(s): burning   Choline Fenofibrate Nausea Only   Other Reaction(s): GI Upset        Medication List     TAKE these medications    acetaminophen  500 MG tablet Commonly known as: TYLENOL  Take 1,000 mg by mouth every 6 (six) hours as needed for mild pain (pain score 1-3) or headache.   albuterol  108 (90 Base) MCG/ACT inhaler Commonly known as: VENTOLIN  HFA Inhale 2 puffs into  the lungs every 4 (four) hours as needed for wheezing or shortness of breath.   ALPRAZolam  0.5 MG tablet Commonly known as: XANAX  Take 1 tablet (0.5 mg total) by mouth at bedtime as needed (for MRI  tests , premedication).   amoxicillin -clavulanate 875-125 MG tablet Commonly known as: AUGMENTIN  Take 1 tablet by mouth 2 (two) times daily for 5 days.   Creon  36000-114000 units Cpep capsule Generic drug: lipase/protease/amylase Take 36,000 Units by mouth 4 (four) times daily.   gabapentin  100 MG capsule Commonly known as: NEURONTIN  Take 100 mg by mouth 3 (three) times daily as needed (Pain).   losartan  50 MG tablet Commonly known as: COZAAR  Take 50 mg by mouth daily.   meclizine  25 MG tablet Commonly known as: ANTIVERT  Take 25 mg by mouth 2 (two) times daily as needed for dizziness.   Nurtec 75 MG Tbdp Generic drug: Rimegepant Sulfate Take at onset of headache , can be taken every other day. What changed:  how much to take how to take this when to take this reasons to take this additional instructions   omeprazole 20 MG capsule Commonly known as: PRILOSEC Take 20 mg by mouth daily as needed (Indigestion).   ondansetron  8 MG tablet Commonly known as: ZOFRAN  Take by mouth every 8 (eight) hours as needed for nausea or vomiting.   pantoprazole  40 MG tablet Commonly known as: PROTONIX  Take 40 mg by  mouth daily.   pravastatin  20 MG tablet Commonly known as: PRAVACHOL  Take 20 mg by mouth daily.   SUMAtriptan 50 MG tablet Commonly known as: IMITREX Take 50 mg by mouth every 2 (two) hours as needed for migraine.   Ubrelvy  100 MG Tabs Generic drug: Ubrogepant  Take 1 tablet (100 mg total) by mouth daily as needed.   Vitamin B12 1000 MCG Tbcr Take 1,000 mcg by mouth daily.   Wixela Inhub 250-50 MCG/ACT Aepb Generic drug: fluticasone -salmeterol Inhale 1 puff into the lungs 2 (two) times daily.        Discharge Exam: Filed Weights   03/29/24 1609 03/29/24 2227   Weight: 75.3 kg 76.6 kg   ***  Condition at discharge: {DC Condition:26389}  The results of significant diagnostics from this hospitalization (including imaging, microbiology, ancillary and laboratory) are listed below for reference.   Imaging Studies: CT Angio Chest PE W/Cm &/Or Wo Cm Result Date: 03/29/2024 CLINICAL DATA:  Pulmonary embolism (PE) suspected, high prob EXAM: CT ANGIOGRAPHY CHEST WITH CONTRAST TECHNIQUE: Multidetector CT imaging of the chest was performed using the standard protocol during bolus administration of intravenous contrast. Multiplanar CT image reconstructions and MIPs were obtained to evaluate the vascular anatomy. RADIATION DOSE REDUCTION: This exam was performed according to the departmental dose-optimization program which includes automated exposure control, adjustment of the mA and/or kV according to patient size and/or use of iterative reconstruction technique. CONTRAST:  OMNIPAQUE  IOHEXOL  350 MG/ML SOLN COMPARISON:  Dec 22, 2023 FINDINGS: Pulmonary Embolism: No pulmonary embolism. Cardiovascular: No cardiomegaly or pericardial effusion.Multi-vessel coronary atherosclerosis.No aortic aneurysm. Mediastinum/Nodes: No mediastinal mass. No mediastinal, hilar, or axillary lymphadenopathy. Lungs/Pleura: The midline trachea and bronchi are patent. Scattered tree-in-bud nodularity noted throughout the right lung. More dense consolidation in the basilar segments of the left lower lobe. Scattered tree-in-bud nodularity is also noted throughout the left upper and left lower lobes. No pleural effusion or pneumothorax. Musculoskeletal: No acute fracture or destructive bone lesion. Multilevel degenerative disc disease of the spine. Upper Abdomen: For findings below the diaphragm, please see the separately dictated CT of the abdomen and pelvis report, which was performed concurrently. Review of the MIP images confirms the above findings. IMPRESSION: 1. Left lower lobe pneumonia with  endobronchial spread of infection throughout both the left and right lungs. No pleural effusion. A 2 view chest radiograph in 6-12 weeks is recommended to document resolution once treatment is complete. 2. No pulmonary embolism. 3. For findings below the diaphragm, please see the separately dictated CT of the abdomen and pelvis report, which was performed concurrently. Aortic Atherosclerosis (ICD10-I70.0). Electronically Signed   By: Rogelia Myers M.D.   On: 03/29/2024 19:27   CT ABDOMEN PELVIS W CONTRAST Result Date: 03/29/2024 CLINICAL DATA:  Abdominal pain, acute, nonlocalized EXAM: CT ABDOMEN AND PELVIS WITH CONTRAST TECHNIQUE: Multidetector CT imaging of the abdomen and pelvis was performed using the standard protocol following bolus administration of intravenous contrast. RADIATION DOSE REDUCTION: This exam was performed according to the departmental dose-optimization program which includes automated exposure control, adjustment of the mA and/or kV according to patient size and/or use of iterative reconstruction technique. CONTRAST:  OMNIPAQUE  IOHEXOL  350 MG/ML SOLN COMPARISON:  February 09, 2022 FINDINGS: Lower chest: For findings above the diaphragm, please see the separately dictated CT of the chest report, which was performed concurrently. Hepatobiliary: No mass.Diffuse hepatic steatosis.Decompressed gallbladder without radiopaque stones or wall thickening. No intrahepatic or extrahepatic biliary ductal dilation. The portal veins are patent. Pancreas: No mass or  main ductal dilation. No peripancreatic inflammation or fluid collection. Spleen: Normal size. No mass. Adrenals/Urinary Tract: No adrenal masses. No renal mass. Punctate nonobstructive left upper pole calculus. No hydronephrosis. Circumferential wall thickening of the urinary bladder. Stomach/Bowel: Small hiatal hernia. The stomach is decompressed without focal abnormality. No small bowel wall thickening or inflammation. No small bowel  obstruction.Normal appendix. Descending and sigmoid colonic diverticulosis. No changes of acute diverticulitis. Vascular/Lymphatic: No aortic aneurysm. Diffuse aortoiliac atherosclerosis. No intraabdominal or pelvic lymphadenopathy. Reproductive: No prostatomegaly.No free pelvic fluid. Other: No pneumoperitoneum, ascites, or mesenteric inflammation. Musculoskeletal: No acute fracture or destructive lesion. Multilevel degenerative disc disease of the spine. IMPRESSION: 1. Circumferential wall thickening of the urinary bladder, which may be due to underdistension. If there is concern for acute cystitis, correlation with urinalysis would be recommended. 2. Punctate nonobstructive left upper pole calyceal calculus. No hydronephrosis. 3. Descending and sigmoid colonic diverticulosis. No changes of acute diverticulitis. 4. Diffuse hepatic steatosis. Aortic Atherosclerosis (ICD10-I70.0). Electronically Signed   By: Rogelia Myers M.D.   On: 03/29/2024 19:21   CT Head Wo Contrast Result Date: 03/29/2024 CLINICAL DATA:  Migraine headache for several days, initial encounter EXAM: CT HEAD WITHOUT CONTRAST TECHNIQUE: Contiguous axial images were obtained from the base of the skull through the vertex without intravenous contrast. RADIATION DOSE REDUCTION: This exam was performed according to the departmental dose-optimization program which includes automated exposure control, adjustment of the mA and/or kV according to patient size and/or use of iterative reconstruction technique. COMPARISON:  12/12/2023 FINDINGS: Brain: No evidence of acute infarction, hemorrhage, hydrocephalus, extra-axial collection or mass lesion/mass effect. Mild basal ganglia calcifications are noted Vascular: No hyperdense vessel or unexpected calcification. Skull: Normal. Negative for fracture or focal lesion. Sinuses/Orbits: Mucosal thickening is noted within the left maxillary and sphenoid sinuses progressed in the interval from the prior exam.  Other: None IMPRESSION: No acute intracranial abnormality noted. Mucosal thickening in the paranasal sinuses. Electronically Signed   By: Oneil Devonshire M.D.   On: 03/29/2024 19:12   DG Chest Port 1 View Result Date: 03/29/2024 CLINICAL DATA:  Cough. EXAM: PORTABLE CHEST 1 VIEW COMPARISON:  November 15, 2023. FINDINGS: The heart size and mediastinal contours are within normal limits. Both lungs are clear. The visualized skeletal structures are unremarkable. IMPRESSION: No active disease. Electronically Signed   By: Lynwood Landy Raddle M.D.   On: 03/29/2024 16:50    Microbiology: Results for orders placed or performed during the hospital encounter of 03/29/24  Resp panel by RT-PCR (RSV, Flu A&B, Covid) Anterior Nasal Swab     Status: None   Collection Time: 03/29/24  5:10 PM   Specimen: Anterior Nasal Swab  Result Value Ref Range Status   SARS Coronavirus 2 by RT PCR NEGATIVE NEGATIVE Final    Comment: (NOTE) SARS-CoV-2 target nucleic acids are NOT DETECTED.  The SARS-CoV-2 RNA is generally detectable in upper respiratory specimens during the acute phase of infection. The lowest concentration of SARS-CoV-2 viral copies this assay can detect is 138 copies/mL. A negative result does not preclude SARS-Cov-2 infection and should not be used as the sole basis for treatment or other patient management decisions. A negative result may occur with  improper specimen collection/handling, submission of specimen other than nasopharyngeal swab, presence of viral mutation(s) within the areas targeted by this assay, and inadequate number of viral copies(<138 copies/mL). A negative result must be combined with clinical observations, patient history, and epidemiological information. The expected result is Negative.  Fact Sheet for Patients:  BloggerCourse.com  Fact Sheet for Healthcare Providers:  SeriousBroker.it  This test is no t yet approved or cleared by  the [place]  FDA and  has been authorized for detection and/or diagnosis of SARS-CoV-2 by FDA under an Emergency Use Authorization (EUA). This EUA will remain  in effect (meaning this test can be used) for the duration of the COVID-19 declaration under Section 564(b)(1) of the Act, 21 U.S.C.section 360bbb-3(b)(1), unless the authorization is terminated  or revoked sooner.       Influenza A by PCR NEGATIVE NEGATIVE Final   Influenza B by PCR NEGATIVE NEGATIVE Final    Comment: (NOTE) The Xpert Xpress SARS-CoV-2/FLU/RSV plus assay is intended as an aid in the diagnosis of influenza from Nasopharyngeal swab specimens and should not be used as a sole basis for treatment. Nasal washings and aspirates are unacceptable for Xpert Xpress SARS-CoV-2/FLU/RSV testing.  Fact Sheet for Patients: BloggerCourse.com  Fact Sheet for Healthcare Providers: SeriousBroker.it  This test is not yet approved or cleared by the [place]  FDA and has been authorized for detection and/or diagnosis of SARS-CoV-2 by FDA under an Emergency Use Authorization (EUA). This EUA will remain in effect (meaning this test can be used) for the duration of the COVID-19 declaration under Section 564(b)(1) of the Act, 21 U.S.C. section 360bbb-3(b)(1), unless the authorization is terminated or revoked.     Resp Syncytial Virus by PCR NEGATIVE NEGATIVE Final    Comment: (NOTE) Fact Sheet for Patients: BloggerCourse.com  Fact Sheet for Healthcare Providers: SeriousBroker.it  This test is not yet approved or cleared by the [place]  FDA and has been authorized for detection and/or diagnosis of SARS-CoV-2 by FDA under an Emergency Use Authorization (EUA). This EUA will remain in effect (meaning this test can be used) for the duration of the COVID-19 declaration under Section 564(b)(1) of the Act, 21  U.S.C. section 360bbb-3(b)(1), unless the authorization is terminated or revoked.  Performed at [facility], [address], [place], [place] [address]   Culture, blood (Routine X 2) w Reflex to ID Panel     Status: None (Preliminary result)   Collection Time: 03/29/24  8:21 PM   Specimen: BLOOD LEFT FOREARM  Result Value Ref Range Status   Specimen Description   Final    BLOOD LEFT FOREARM Performed at [facility], [address] [place], [place] [address]    Special Requests   Final    BOTTLES DRAWN AEROBIC AND ANAEROBIC Blood Culture results may not be optimal due to an inadequate volume of blood received in culture bottles Performed at [facility], [address] [place], [place] [address]    Culture   Final    NO GROWTH 2 DAYS Performed at [facility], [address], [place], [place] [address]    Report Status PENDING  Incomplete  Culture, blood (Routine X 2) w Reflex to ID Panel     Status: None (Preliminary result)   Collection Time: 03/29/24  8:22 PM   Specimen: BLOOD RIGHT FOREARM  Result Value Ref Range Status   Specimen Description   Final    BLOOD RIGHT FOREARM Performed at [facility], [address], [place], [place] [address]    Special Requests   Final    BOTTLES DRAWN AEROBIC AND ANAEROBIC Blood Culture results may not be optimal due to an inadequate volume of blood received in culture bottles Performed at [facility], [address] [place], [place]  [address]    Culture   Final    NO GROWTH 2 DAYS Performed at [facility], [address] [place], [place] [address]    Report Status PENDING  Incomplete    Labs: CBC: Recent Labs  Lab 03/29/24 1710 03/30/24 0920 03/31/24 0557 04/01/24 0425  WBC 4.2 4.9 4.8 5.5  NEUTROABS 3.0  --   --   --   HGB 15.7 12.6* 11.8* 12.6*  HCT 42.8 36.0* 32.9* 34.3*  MCV 92.4 95.7 92.2 91.5  PLT 132* 123* 138* 198   Basic  Metabolic Panel: Recent Labs  Lab 03/29/24 1710 03/30/24 0920 03/31/24 0557 03/31/24 1603 04/01/24 0425  NA 128* 130* 131*  --  131*  K 3.7 3.7 3.1*  --  3.5  CL 88* 99 99  --  98  CO2 20* 18* 20*  --  22  GLUCOSE 166* 108* 89  --  110*  BUN 15 8 10   --  11  CREATININE 1.23 0.92 0.92  --  0.93  CALCIUM 10.9* 9.1 9.2  --  9.4  MG  --   --   --  1.6*  --    Liver Function Tests: Recent Labs  Lab 03/29/24 1710  AST 26  ALT 24  ALKPHOS 80  BILITOT 1.0  PROT 8.6*  ALBUMIN 4.5   CBG: Recent Labs  Lab 03/31/24 0831 03/31/24 1227 03/31/24 1638 03/31/24 2131 04/01/24 0759  GLUCAP 88 124* 139* 160* 169*    Discharge time spent: {LESS THAN/GREATER THAN:26388} 30 minutes.  Signed: Concepcion Riser, MD [facility] 04/01/2024

## 2024-04-01 NOTE — Plan of Care (Signed)
 Problem: Education: Goal: Knowledge of General Education information will improve Description: Including pain rating scale, medication(s)/side effects and non-pharmacologic comfort measures 04/01/2024 1226 by Jacquelyn Heather RAMAN, RN Outcome: Adequate for Discharge 04/01/2024 1225 by Jacquelyn Heather RAMAN, RN Outcome: Progressing   Problem: Health Behavior/Discharge Planning: Goal: Ability to manage health-related needs will improve 04/01/2024 1226 by Jacquelyn Heather RAMAN, RN Outcome: Adequate for Discharge 04/01/2024 1225 by Jacquelyn Heather RAMAN, RN Outcome: Progressing   Problem: Clinical Measurements: Goal: Ability to maintain clinical measurements within normal limits will improve 04/01/2024 1226 by Jacquelyn Heather RAMAN, RN Outcome: Adequate for Discharge 04/01/2024 1225 by Jacquelyn Heather RAMAN, RN Outcome: Progressing Goal: Will remain free from infection 04/01/2024 1226 by Jacquelyn Heather RAMAN, RN Outcome: Adequate for Discharge 04/01/2024 1225 by Jacquelyn Heather RAMAN, RN Outcome: Progressing Goal: Diagnostic test results will improve 04/01/2024 1226 by Jacquelyn Heather RAMAN, RN Outcome: Adequate for Discharge 04/01/2024 1225 by Jacquelyn Heather RAMAN, RN Outcome: Progressing Goal: Respiratory complications will improve 04/01/2024 1226 by Jacquelyn Heather RAMAN, RN Outcome: Adequate for Discharge 04/01/2024 1225 by Jacquelyn Heather RAMAN, RN Outcome: Progressing Goal: Cardiovascular complication will be avoided 04/01/2024 1226 by Jacquelyn Heather RAMAN, RN Outcome: Adequate for Discharge 04/01/2024 1225 by Jacquelyn Heather RAMAN, RN Outcome: Progressing   Problem: Activity: Goal: Risk for activity intolerance will decrease 04/01/2024 1226 by Jacquelyn Heather RAMAN, RN Outcome: Adequate for Discharge 04/01/2024 1225 by Jacquelyn Heather RAMAN, RN Outcome: Progressing   Problem: Nutrition: Goal: Adequate nutrition will be maintained 04/01/2024  1226 by Jacquelyn Heather RAMAN, RN Outcome: Adequate for Discharge 04/01/2024 1225 by Jacquelyn Heather RAMAN, RN Outcome: Progressing   Problem: Coping: Goal: Level of anxiety will decrease 04/01/2024 1226 by Jacquelyn Heather RAMAN, RN Outcome: Adequate for Discharge 04/01/2024 1225 by Jacquelyn Heather RAMAN, RN Outcome: Progressing   Problem: Elimination: Goal: Will not experience complications related to bowel motility 04/01/2024 1226 by Jacquelyn Heather RAMAN, RN Outcome: Adequate for Discharge 04/01/2024 1225 by Jacquelyn Heather RAMAN, RN Outcome: Progressing Goal: Will not experience complications related to urinary retention 04/01/2024 1226 by Jacquelyn Heather RAMAN, RN Outcome: Adequate for Discharge 04/01/2024 1225 by Jacquelyn Heather RAMAN, RN Outcome: Progressing   Problem: Pain Managment: Goal: General experience of comfort will improve and/or be controlled 04/01/2024 1226 by Jacquelyn Heather RAMAN, RN Outcome: Adequate for Discharge 04/01/2024 1225 by Jacquelyn Heather RAMAN, RN Outcome: Progressing   Problem: Safety: Goal: Ability to remain free from injury will improve 04/01/2024 1226 by Jacquelyn Heather RAMAN, RN Outcome: Adequate for Discharge 04/01/2024 1225 by Jacquelyn Heather RAMAN, RN Outcome: Progressing   Problem: Skin Integrity: Goal: Risk for impaired skin integrity will decrease 04/01/2024 1226 by Jacquelyn Heather RAMAN, RN Outcome: Adequate for Discharge 04/01/2024 1225 by Jacquelyn Heather RAMAN, RN Outcome: Progressing   Problem: Activity: Goal: Ability to tolerate increased activity will improve 04/01/2024 1226 by Jacquelyn Heather RAMAN, RN Outcome: Adequate for Discharge 04/01/2024 1225 by Jacquelyn Heather RAMAN, RN Outcome: Progressing   Problem: Clinical Measurements: Goal: Ability to maintain a body temperature in the normal range will improve 04/01/2024 1226 by Jacquelyn Heather RAMAN, RN Outcome: Adequate for Discharge 04/01/2024  1225 by Jacquelyn Heather RAMAN, RN Outcome: Progressing   Problem: Respiratory: Goal: Ability to maintain adequate ventilation will improve 04/01/2024 1226 by Jacquelyn Heather RAMAN, RN Outcome: Adequate for Discharge 04/01/2024 1225 by Jacquelyn Heather RAMAN, RN Outcome: Progressing Goal: Ability to maintain a clear airway will improve 04/01/2024 1226 by Jacquelyn Heather RAMAN, RN Outcome: Adequate for Discharge 04/01/2024 1225 by Jacquelyn Heather RAMAN, RN Outcome:  Progressing   Problem: Education: Goal: Ability to describe self-care measures that may prevent or decrease complications (Diabetes Survival Skills Education) will improve 04/01/2024 1226 by Jacquelyn Heather RAMAN, RN Outcome: Adequate for Discharge 04/01/2024 1225 by Jacquelyn Heather RAMAN, RN Outcome: Progressing Goal: Individualized Educational Video(s) 04/01/2024 1226 by Jacquelyn Heather RAMAN, RN Outcome: Adequate for Discharge 04/01/2024 1225 by Jacquelyn Heather RAMAN, RN Outcome: Progressing   Problem: Coping: Goal: Ability to adjust to condition or change in health will improve 04/01/2024 1226 by Jacquelyn Heather RAMAN, RN Outcome: Adequate for Discharge 04/01/2024 1225 by Jacquelyn Heather RAMAN, RN Outcome: Progressing   Problem: Fluid Volume: Goal: Ability to maintain a balanced intake and output will improve 04/01/2024 1226 by Jacquelyn Heather RAMAN, RN Outcome: Adequate for Discharge 04/01/2024 1225 by Jacquelyn Heather RAMAN, RN Outcome: Progressing   Problem: Health Behavior/Discharge Planning: Goal: Ability to identify and utilize available resources and services will improve 04/01/2024 1226 by Jacquelyn Heather RAMAN, RN Outcome: Adequate for Discharge 04/01/2024 1225 by Jacquelyn Heather RAMAN, RN Outcome: Progressing Goal: Ability to manage health-related needs will improve 04/01/2024 1226 by Jacquelyn Heather RAMAN, RN Outcome: Adequate for Discharge 04/01/2024 1225 by Jacquelyn Heather RAMAN, RN Outcome: Progressing   Problem: Metabolic: Goal: Ability to maintain appropriate glucose levels will improve 04/01/2024 1226 by Jacquelyn Heather RAMAN, RN Outcome: Adequate for Discharge 04/01/2024 1225 by Jacquelyn Heather RAMAN, RN Outcome: Progressing   Problem: Nutritional: Goal: Maintenance of adequate nutrition will improve 04/01/2024 1226 by Jacquelyn Heather RAMAN, RN Outcome: Adequate for Discharge 04/01/2024 1225 by Jacquelyn Heather RAMAN, RN Outcome: Progressing Goal: Progress toward achieving an optimal weight will improve 04/01/2024 1226 by Jacquelyn Heather RAMAN, RN Outcome: Adequate for Discharge 04/01/2024 1225 by Jacquelyn Heather RAMAN, RN Outcome: Progressing   Problem: Skin Integrity: Goal: Risk for impaired skin integrity will decrease 04/01/2024 1226 by Jacquelyn Heather RAMAN, RN Outcome: Adequate for Discharge 04/01/2024 1225 by Jacquelyn Heather RAMAN, RN Outcome: Progressing   Problem: Tissue Perfusion: Goal: Adequacy of tissue perfusion will improve 04/01/2024 1226 by Jacquelyn Heather RAMAN, RN Outcome: Adequate for Discharge 04/01/2024 1225 by Jacquelyn Heather RAMAN, RN Outcome: Progressing

## 2024-04-01 NOTE — TOC CM/SW Note (Signed)
 Transition of Care [facility]) - Inpatient Brief Assessment   Patient Details  Name: Charles Wolf MRN: [id number] Date of Birth: 30-May-1966  Transition of Care [facility]) CM/SW Contact:    Lauraine FORBES Saa, LCSWA Phone Number: 04/01/2024, 11:51 AM   Clinical Narrative:  11:51 AM Per chart review, patient resides at home with spouse. Patient has a PCP and insurance. Patient does not have SNF/HH/DME history. Patient's preferred pharmacy is [facility]. No TOC needs were identified at this time. TOC will continue to follow and be available to assist.  Transition of Care Asessment: Insurance and Status: Insurance coverage has been reviewed Patient has primary care physician: Yes Home environment has been reviewed: Private Residence Prior level of function:: N/A Prior/Current Home Services: No current home services Social Drivers of Health Review: SDOH reviewed no interventions necessary Readmission risk has been reviewed: Yes (Currently Green 14%) Transition of care needs: no transition of care needs at this time

## 2024-04-01 NOTE — Plan of Care (Signed)

## 2024-04-03 DIAGNOSIS — E1165 Type 2 diabetes mellitus with hyperglycemia: Secondary | ICD-10-CM | POA: Diagnosis not present

## 2024-04-03 DIAGNOSIS — J189 Pneumonia, unspecified organism: Secondary | ICD-10-CM | POA: Diagnosis not present

## 2024-04-03 DIAGNOSIS — H6592 Unspecified nonsuppurative otitis media, left ear: Secondary | ICD-10-CM | POA: Diagnosis not present

## 2024-04-03 DIAGNOSIS — H9312 Tinnitus, left ear: Secondary | ICD-10-CM | POA: Diagnosis not present

## 2024-04-03 LAB — HEMOGLOBIN A1C
Hgb A1c MFr Bld: 8.7 % — ABNORMAL HIGH (ref 4.8–5.6)
Mean Plasma Glucose: 203 mg/dL

## 2024-04-04 LAB — CULTURE, BLOOD (ROUTINE X 2)
Culture: NO GROWTH
Culture: NO GROWTH

## 2024-04-18 DIAGNOSIS — R0602 Shortness of breath: Secondary | ICD-10-CM | POA: Diagnosis not present

## 2024-06-04 DIAGNOSIS — E1165 Type 2 diabetes mellitus with hyperglycemia: Secondary | ICD-10-CM | POA: Diagnosis not present

## 2024-06-04 DIAGNOSIS — I10 Essential (primary) hypertension: Secondary | ICD-10-CM | POA: Diagnosis not present

## 2024-06-04 DIAGNOSIS — Z23 Encounter for immunization: Secondary | ICD-10-CM | POA: Diagnosis not present

## 2024-06-04 DIAGNOSIS — G43109 Migraine with aura, not intractable, without status migrainosus: Secondary | ICD-10-CM | POA: Diagnosis not present

## 2024-06-18 DIAGNOSIS — K219 Gastro-esophageal reflux disease without esophagitis: Secondary | ICD-10-CM | POA: Diagnosis not present

## 2024-06-18 DIAGNOSIS — Z720 Tobacco use: Secondary | ICD-10-CM | POA: Diagnosis not present

## 2024-06-18 DIAGNOSIS — R55 Syncope and collapse: Secondary | ICD-10-CM | POA: Diagnosis not present

## 2024-06-18 DIAGNOSIS — I10 Essential (primary) hypertension: Secondary | ICD-10-CM | POA: Diagnosis not present

## 2024-07-22 NOTE — Progress Notes (Signed)
  Cardiology Office Note:   Date:  07/22/2024  ID:  Charles Wolf, DOB 27-Feb-1966, MRN [id number] PCP: Dyane Anthony RAMAN, FNP  [facility] Providers Cardiologist:  None { Chief Complaint: No chief complaint on file.     History of Present Illness:   Charles Wolf is a 58 y.o. male with a PMH of HTN, HLD, DM2, tobacco use disorder, migraines who presents as a new patient referral by Clemencia Dyane for the evaluation of syncope and HTN.   Past Medical History:  Diagnosis Date   Diabetes mellitus without complication (HCC)    controlled with diet   History of kidney stones    Hyperlipidemia    Hypertension    Pancreatitis      Studies Reviewed:    EKG: ***           Risk Assessment/Calculations:   {Does this patient have ATRIAL FIBRILLATION?:[phone]} No BP recorded.  {Refresh Note OR Click here to enter BP  :1}***        Physical Exam:     VS:  There were no vitals taken for this visit. ***    Wt Readings from Last 3 Encounters:  03/29/24 168 lb 14 oz (76.6 kg)  03/25/24 166 lb (75.3 kg)  12/12/23 168 lb (76.2 kg)     GEN: Well nourished, well developed, in no acute distress NECK: No JVD; No carotid bruits CARDIAC: ***RRR, no murmurs, rubs, gallops RESPIRATORY:  Clear to auscultation without rales, wheezing or rhonchi  ABDOMEN: Soft, non-tender, non-distended, normal bowel sounds EXTREMITIES:  Warm and well perfused, no edema; No deformity, 2+ radial pulses PSYCH: Normal mood and affect   Assessment & Plan       {Are you ordering a CV Procedure (e.g. stress test, cath, DCCV, TEE, etc)?   Press F2        :[id number]}   This note was written with the assistance of a dictation microphone or AI dictation software. Please excuse any typos or grammatical errors.   Signed, Georganna Archer, MD 07/22/2024 10:48 PM    [facility]

## 2024-07-23 ENCOUNTER — Encounter: Payer: Self-pay | Admitting: Student in an Organized Health Care Education/Training Program

## 2024-07-23 ENCOUNTER — Ambulatory Visit
Attending: Student in an Organized Health Care Education/Training Program | Admitting: Student in an Organized Health Care Education/Training Program

## 2024-07-23 ENCOUNTER — Ambulatory Visit

## 2024-07-23 VITALS — BP 168/100 | HR 78 | Ht 69.0 in | Wt 170.0 lb

## 2024-07-23 DIAGNOSIS — E785 Hyperlipidemia, unspecified: Secondary | ICD-10-CM | POA: Diagnosis not present

## 2024-07-23 DIAGNOSIS — I10 Essential (primary) hypertension: Secondary | ICD-10-CM

## 2024-07-23 DIAGNOSIS — R072 Precordial pain: Secondary | ICD-10-CM

## 2024-07-23 DIAGNOSIS — R55 Syncope and collapse: Secondary | ICD-10-CM

## 2024-07-23 MED ORDER — LOSARTAN POTASSIUM 100 MG PO TABS: 100.0000 mg | ORAL_TABLET | Freq: Every day | ORAL | 3 refills | Status: AC

## 2024-07-23 MED ORDER — METOPROLOL TARTRATE 100 MG PO TABS: ORAL_TABLET | ORAL | 0 refills | Status: AC

## 2024-07-23 MED ORDER — AMLODIPINE BESYLATE 10 MG PO TABS: 10.0000 mg | ORAL_TABLET | Freq: Every day | ORAL | 3 refills | Status: AC

## 2024-07-23 NOTE — Assessment & Plan Note (Signed)
-   Patient has uncontrolled HTN and certainly needs better control. -His blood pressure drops have made his hypertension treatment more challenging though.  Orthostatics were negative in clinic so I do not think that any of his medications such as losartan  are causing him to have the syncopal events. -Feel comfortable increasing his amlodipine and losartan  doses to see if he gets improved blood pressure control. -More about his syncope below. Increase amlodipine to 10 mg daily Increase losartan  to 100 mg daily Follow-up in 8 weeks

## 2024-07-23 NOTE — Patient Instructions (Addendum)
 Medication Instructions:  Increase Amlodipine 10 mg daily Increase Losartan  100 mg daily   *If you need a refill on your cardiac medications before your next appointment, please call your pharmacy*  Lab Work: BMET, Lipid panel, LPa  Testing/Procedures: Your physician has requested that you have an echocardiogram. Echocardiography is a painless test that uses sound waves to create images of your heart. It provides your doctor with information about the size and shape of your heart and how well your heart's chambers and valves are working. This procedure takes approximately one hour. There are no restrictions for this procedure. Please do NOT wear cologne, perfume, aftershave, or lotions (deodorant is allowed). Please arrive 15 minutes prior to your appointment time.  Please note: We ask at that you not bring children with you during ultrasound (echo/ vascular) testing. Due to room size and safety concerns, children are not allowed in the ultrasound rooms during exams. Our front office staff cannot provide observation of children in our lobby area while testing is being conducted. An adult accompanying a patient to their appointment will only be allowed in the ultrasound room at the discretion of the ultrasound technician under special circumstances. We apologize for any inconvenience.    Your cardiac CT will be scheduled at one of the below locations:   [facility] [address]  [place], [place] [address]   All radiology patients and guests should use entrance C2 at [facility], accessed from [facility], even though the hospital's physical address listed is [address].  If scheduled at the Heart and Vascular Tower at [address], please enter the parking lot using the [address] entrance and use the FREE valet service at the patient drop-off area. Enter the building and check-in with registration on the main  floor.  Please follow these instructions carefully (unless otherwise directed):  An IV will be required for this test and Nitroglycerin will be given.  Hold all erectile dysfunction medications at least 3 days (72 hrs) prior to test. (Ie viagra, cialis, sildenafil, tadalafil, etc)   On the Night Before the Test: Be sure to Drink plenty of water. Do not consume any caffeinated/decaffeinated beverages or chocolate 12 hours prior to your test. Do not take any antihistamines 12 hours prior to your test.  On the Day of the Test: Drink plenty of water until 1 hour prior to the test. Do not eat any food 1 hour prior to test. You may take your regular medications prior to the test.  Take metoprolol (Lopressor) 100 mg two hours prior to test (sent to pts pharmacy). If you take Furosemide/Hydrochlorothiazide/Spironolactone/Chlorthalidone, please HOLD on the morning of the test. Patients who wear a continuous glucose monitor MUST remove the device prior to scanning. FEMALES- please wear underwire-free bra if available, avoid dresses & tight clothing      After the Test: Drink plenty of water. After receiving IV contrast, you may experience a mild flushed feeling. This is normal. On occasion, you may experience a mild rash up to 24 hours after the test. This is not dangerous. If this occurs, you can take Benadryl  25 mg, Zyrtec, Claritin, or Allegra and increase your fluid intake. (Patients taking Tikosyn should avoid Benadryl , and may take Zyrtec, Claritin, or Allegra) If you experience trouble breathing, this can be serious. If it is severe call 911 IMMEDIATELY. If it is mild, please call our office.  We will call to schedule your test 2-4 weeks  out understanding that some insurance companies will need an authorization prior to the service being performed.   For more information and frequently asked questions, please visit our website : [email or web address]  For non-scheduling  related questions, please contact the cardiac imaging nurse navigator should you have any questions/concerns: Cardiac Imaging Nurse Navigators Direct Office Dial: [phone]   For scheduling needs, including cancellations and rescheduling, please call Brittany, [phone].   ZIO XT- Long Term Monitor Instructions  Your physician has requested you wear a ZIO patch monitor for 14 days.  This is a single patch monitor. [employer] supplies one patch monitor per enrollment. Additional stickers are not available. Please do not apply patch if you will be having a Nuclear Stress Test,  Echocardiogram, Cardiac CT, MRI, or Chest Xray during the period you would be wearing the  monitor. The patch cannot be worn during these tests. You cannot remove and re-apply the  ZIO XT patch monitor.  Your ZIO patch monitor will be mailed 3 day USPS to your address on file. It may take 3-5 days  to receive your monitor after you have been enrolled.  Once you have received your monitor, please review the enclosed instructions. Your monitor  has already been registered assigning a specific monitor serial # to you.  Billing and Patient Assistance Program Information  We have supplied [employer] with any of your insurance information on file for billing purposes. [employer] offers a sliding scale Patient Assistance Program for patients that do not have  insurance, or whose insurance does not completely cover the cost of the ZIO monitor.  You must apply for the Patient Assistance Program to qualify for this discounted rate.  To apply, please call [employer] at [phone], select option 4, select option 2, ask to apply for  Patient Assistance Program. Meredeth will ask your household income, and how many people  are in your household. They will quote your out-of-pocket cost based on that information.  [employer] will also be able to set up a 47-month, interest-free payment plan if needed.  Applying the monitor   Shave hair  from upper left chest.  Hold abrader disc by orange tab. Rub abrader in 40 strokes over the upper left chest as  indicated in your monitor instructions.  Clean area with 4 enclosed alcohol pads. Let dry.  Apply patch as indicated in monitor instructions. Patch will be placed under collarbone on left  side of chest with arrow pointing upward.  Rub patch adhesive wings for 2 minutes. Remove white label marked 1. Remove the white  label marked 2. Rub patch adhesive wings for 2 additional minutes.  While looking in a mirror, press and release button in center of patch. A small green light will  flash 3-4 times. This will be your only indicator that the monitor has been turned on.  Do not shower for the first 24 hours. You may shower after the first 24 hours.  Press the button if you feel a symptom. You will hear a small click. Record Date, Time and  Symptom in the Patient Logbook.  When you are ready to remove the patch, follow instructions on the last 2 pages of Patient  Logbook. Stick patch monitor onto the last page of Patient Logbook.  Place Patient Logbook in the blue and white box. Use locking tab on box and tape box closed  securely. The blue and white box has prepaid postage on it. Please place it in the mailbox as  soon as possible. Your  physician should have your test results approximately 7 days after the  monitor has been mailed back to [facility].  Call [facility] Customer Care at [phone] if you have questions regarding  your ZIO XT patch monitor. Call them immediately if you see an orange light blinking on your  monitor.  If your monitor falls off in less than 4 days, contact our Monitor department at [phone].  If your monitor becomes loose or falls off after 4 days call [employer] at [phone] for  suggestions on securing your monitor     Follow-Up: At [facility], you and your health needs are our priority.  As part of our continuing mission  to provide you with exceptional heart care, our providers are all part of one team.  This team includes your primary Cardiologist (physician) and Advanced Practice Providers or APPs (Physician Assistants and Nurse Practitioners) who all work together to provide you with the care you need, when you need it.  Your next appointment:   6-8 week(s)  Provider:   Georganna Archer, MD    We recommend signing up for the patient portal called MyChart.  Sign up information is provided on this After Visit Summary.  MyChart is used to connect with patients for Virtual Visits (Telemedicine).  Patients are able to view lab/test results, encounter notes, upcoming appointments, etc.  Non-urgent messages can be sent to your provider as well.   To learn more about what you can do with MyChart, go to [email or web address].   Other Instructions Compression socks recommended. Lowest strength 15-20 mmHg

## 2024-07-23 NOTE — Progress Notes (Unsigned)
 Enrolled for [employer] to mail a ZIO XT long term holter monitor to the patients address on file.

## 2024-07-24 ENCOUNTER — Ambulatory Visit: Payer: Self-pay | Admitting: Student in an Organized Health Care Education/Training Program

## 2024-07-24 DIAGNOSIS — E785 Hyperlipidemia, unspecified: Secondary | ICD-10-CM

## 2024-07-24 LAB — BASIC METABOLIC PANEL WITH GFR
BUN/Creatinine Ratio: 8 — ABNORMAL LOW (ref 9–20)
BUN: 10 mg/dL (ref 6–24)
CO2: 21 mmol/L (ref 20–29)
Calcium: 10.6 mg/dL — ABNORMAL HIGH (ref 8.7–10.2)
Chloride: 96 mmol/L (ref 96–106)
Creatinine, Ser: 1.24 mg/dL (ref 0.76–1.27)
Glucose: 132 mg/dL — ABNORMAL HIGH (ref 70–99)
Potassium: 4.7 mmol/L (ref 3.5–5.2)
Sodium: 135 mmol/L (ref 134–144)
eGFR: 67 mL/min/1.73 (ref >59)

## 2024-07-24 LAB — LIPID PANEL
Chol/HDL Ratio: 3.8 ratio (ref 0.0–5.0)
Cholesterol, Total: 242 mg/dL — ABNORMAL HIGH (ref 100–199)
HDL: 64 mg/dL (ref >39)
LDL Chol Calc (NIH): 149 mg/dL — ABNORMAL HIGH (ref 0–99)
Triglycerides: 161 mg/dL — ABNORMAL HIGH (ref 0–149)
VLDL Cholesterol Cal: 29 mg/dL (ref 5–40)

## 2024-07-24 LAB — LIPOPROTEIN A (LPA): Lipoprotein (a): <8.4 nmol/L (ref <75.0)

## 2024-07-25 MED ORDER — ROSUVASTATIN CALCIUM 40 MG PO TABS: 40.0000 mg | ORAL_TABLET | Freq: Every day | ORAL | 3 refills | Status: AC

## 2024-07-25 NOTE — Telephone Encounter (Signed)
 Spoke with pt, he agrees with the change in medications. New script sent to the pharmacy and Lab orders mailed to the pt

## 2024-08-08 ENCOUNTER — Encounter (HOSPITAL_COMMUNITY): Payer: Self-pay

## 2024-08-12 ENCOUNTER — Ambulatory Visit (HOSPITAL_COMMUNITY)
Admission: RE | Admit: 2024-08-12 | Discharge: 2024-08-12 | Disposition: A | Discharge status: Home / Self Care | Source: Ambulatory Visit | Attending: Student in an Organized Health Care Education/Training Program | Admitting: Student in an Organized Health Care Education/Training Program

## 2024-08-12 DIAGNOSIS — R072 Precordial pain: Secondary | ICD-10-CM | POA: Insufficient documentation

## 2024-08-12 MED ORDER — METOPROLOL TARTRATE 5 MG/5ML IV SOLN
10.0000 mg | INTRAVENOUS | Status: DC | PRN
  Administered 2024-08-12: 10 mg via INTRAVENOUS

## 2024-08-12 MED ORDER — IOHEXOL 350 MG/ML SOLN
100.0000 mL | Freq: Once | INTRAVENOUS | Status: AC | PRN
  Administered 2024-08-12: 100 mL via INTRAVENOUS

## 2024-08-12 MED ORDER — METOPROLOL TARTRATE 5 MG/5ML IV SOLN
5.0000 mg | Freq: Once | INTRAVENOUS | Status: AC
  Administered 2024-08-12: 5 mg via INTRAVENOUS

## 2024-08-12 MED ORDER — METOPROLOL TARTRATE 5 MG/5ML IV SOLN
INTRAVENOUS | Status: AC
  Filled 2024-08-12: qty 20

## 2024-08-12 MED ORDER — NITROGLYCERIN 0.4 MG SL SUBL
0.8000 mg | SUBLINGUAL_TABLET | Freq: Once | SUBLINGUAL | Status: AC
  Administered 2024-08-12: 0.8 mg via SUBLINGUAL

## 2024-08-12 MED ORDER — DILTIAZEM HCL 25 MG/5ML IV SOLN: 10.0000 mg | INTRAVENOUS | Status: DC | PRN

## 2024-08-12 MED FILL — Nitroglycerin SL Tab 0.4 MG: SUBLINGUAL | Qty: 2 | Status: AC

## 2024-08-13 ENCOUNTER — Other Ambulatory Visit: Payer: Self-pay | Admitting: Cardiovascular Disease

## 2024-08-13 ENCOUNTER — Ambulatory Visit (HOSPITAL_COMMUNITY)
Admission: RE | Admit: 2024-08-13 | Discharge: 2024-08-13 | Disposition: A | Discharge status: Home / Self Care | Source: Ambulatory Visit | Attending: Cardiovascular Disease | Admitting: Cardiovascular Disease

## 2024-08-13 DIAGNOSIS — R931 Abnormal findings on diagnostic imaging of heart and coronary circulation: Secondary | ICD-10-CM | POA: Insufficient documentation

## 2024-08-13 NOTE — Progress Notes (Signed)
 CT FFR ordered.  Signed, Darryle DASEN. Barbaraann, MD, [facility]  [facility]  [address] [place], [place] [address] [phone]  11:43 AM

## 2024-08-19 ENCOUNTER — Telehealth: Payer: Self-pay | Admitting: Student in an Organized Health Care Education/Training Program

## 2024-08-19 MED ORDER — ASPIRIN 81 MG PO TBEC: 81.0000 mg | DELAYED_RELEASE_TABLET | Freq: Every day | ORAL | 3 refills | Status: AC

## 2024-08-19 NOTE — Telephone Encounter (Signed)
 I called the patient to discuss his CCTA results. He has elevated CAC score but FFR is negative. His LAD lesion is almost borderline however, which gives me pause. I explained these results to the patient. He still has intermittent chest pains that are not directly exertional. We will start a baby aspirin to take 81 mg daily and focus on medical treatment for now. Can consider LHC if symptoms persist. The patient is in agreement with this plan.  Kierah Goatley T. Floretta HEATH, MD [place]  [facility]  08/19/2024 10:28 AM

## 2024-08-19 NOTE — Addendum Note (Signed)
 Addended by: MANDA BOTTCHER B on: 08/19/2024 01:01 PM   Modules accepted: Orders

## 2024-08-22 DIAGNOSIS — R55 Syncope and collapse: Secondary | ICD-10-CM

## 2024-08-29 ENCOUNTER — Encounter: Payer: Self-pay | Admitting: Neurology

## 2024-08-29 ENCOUNTER — Ambulatory Visit: Admitting: Neurology

## 2024-08-29 VITALS — BP 140/78 | HR 94 | Ht 69.0 in | Wt 172.0 lb

## 2024-08-29 DIAGNOSIS — F17200 Nicotine dependence, unspecified, uncomplicated: Secondary | ICD-10-CM | POA: Diagnosis not present

## 2024-08-29 DIAGNOSIS — I158 Other secondary hypertension: Secondary | ICD-10-CM | POA: Diagnosis not present

## 2024-08-29 DIAGNOSIS — G43701 Chronic migraine without aura, not intractable, with status migrainosus: Secondary | ICD-10-CM | POA: Diagnosis not present

## 2024-08-29 DIAGNOSIS — R0981 Nasal congestion: Secondary | ICD-10-CM

## 2024-08-29 DIAGNOSIS — J441 Chronic obstructive pulmonary disease with (acute) exacerbation: Secondary | ICD-10-CM | POA: Diagnosis not present

## 2024-08-29 DIAGNOSIS — Z9189 Other specified personal risk factors, not elsewhere classified: Secondary | ICD-10-CM | POA: Insufficient documentation

## 2024-08-29 DIAGNOSIS — R0683 Snoring: Secondary | ICD-10-CM | POA: Diagnosis not present

## 2024-08-29 NOTE — Progress Notes (Signed)
 "        Provider:  Dedra Gores, MD  Primary Care Physician:  Charles Anthony RAMAN, FNP [address] [place] [address]     Referring Provider: Douglass Ivanoff, Md [address] [place],  [place] [address]          Chief Complaint according to patient   Patient presents with:                HISTORY OF PRESENT ILLNESS:  Charles Wolf is a 59 y.o. male patient who is here for revisit 08/29/2024 for  migraine , chonic migraines have become less frequent since last visit on 03-25-2024 . He was hospitalized in [facility] hospital soon after . Since August 2025 , he developed more HTN,  had pneumonia and chest pain and SOB, he had high blood sugars, DM2.    Charles Wolf is a 59 y.o. male patient who is seen upon referral on 03/25/2024 from Anthony Dyane, NP,  for a headache evaluation.  Chief concern according to patient : [name] reports that he has for the last 9 months suffered of near daily headaches that have migrainous character they are associated with phonophobia blurred vision nausea dizziness.  He was given sumatriptan but it does not alleviate the headache, he had also history of multiple concussions, chronic sinusitis, he was already seen by ENT surgeons and diagnosed with an ethmoidal sphenoidal sinusitis, he has been tried on antibiotics but none of this has changed his headaches.  Spontaneously summer in the last 3 months he had a week without headaches but it was not in relationship to a new medication or procedure.   He has been for over 4 decades a heavy smoker 1 pack/day, He denies having any alcohol problems.  He also has a history of liver disease, and he is taking Creon  for pain in the abdomen.   Suspected to reflect an pancreatic insufficiency. He had several concussions in his life , age 63 - 37.       HA medical history:  often had sinus headaches in the past, not daily, not chronic and not migraine-  2 years ago onset of first  migraine .  NoTonsillectomy or sinus surgery , Family medical /sleep history: no  other family member with  migraines Social history: patient ran a sport and exercise psychologist, maintenance.    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 The patient currently works daytime Tobacco use; yes .  1 ppd/ 43 years   ETOH use ; yes , alcohol  use 2-3 glasses on weekend nights.    Caffeine  intake in form of Coffee( 2 cups a day ) Soda( /) Tea ( /)nor energy drinks Exercise in form of  walking .   Hobbies : yard work      Review of Systems: Out of a complete 14 system review, the patient complains of only the following symptoms, and all other reviewed systems are negative.:   SLEEPINESS ?  How likely are you to doze in the following situations: 0 = not likely, 1 = slight chance, 2 = moderate chance, 3 = high chance  Sitting and Reading? Watching Television? Sitting inactive in a public place (theater or meeting)? Lying down in the afternoon when circumstances permit? Sitting and talking to someone?  Sitting quietly after lunch without alcohol? In a car, while stopped for a few minutes in traffic? As a passenger in a car for an hour without a break?  Total = 8/ 24 points   FSS endorsed at 44/ 63 points.        Social History   Socioeconomic History   Marital status: Married    Spouse name: Not on file   Number of children: 1   Years of education: Not on file   Highest education level: Not on file  Occupational History   Not on file  Tobacco Use   Smoking status: Every Day    Current packs/day: 1.00    Average packs/day: 1 pack/day for 43.0 years (43.0 ttl pk-yrs)    Types: Cigarettes    Start date: 34   Smokeless tobacco: Never  Vaping Use   Vaping status: Never Used  Substance and Sexual Activity   Alcohol use: Yes    Comment: socially    Drug use: Never   Sexual activity: Not on file  Other Topics Concern   Not on file  Social History Narrative   2025 - R handed,    1.5 cups of caffeine  daily.    Social Drivers of Health   Tobacco Use: High Risk (08/29/2024)   Patient History    Smoking Tobacco Use: Every Day    Smokeless Tobacco Use: Never    Passive Exposure: Not on file  Financial Resource Strain: Not on file  Food Insecurity: No Food Insecurity (03/30/2024)   Epic    Worried About Programme Researcher, Broadcasting/film/video in the Last Year: Never true    Ran Out of Food in the Last Year: Never true  Transportation Needs: No Transportation Needs (03/30/2024)   Epic    Lack of Transportation (Medical): No    Lack of Transportation (Non-Medical): No  Physical Activity: Not on file  Stress: Not on file  Social Connections: Not on file  Depression (EYV7-0): Not on file  Alcohol Screen: Not on file  Housing: Low Risk (03/30/2024)   Epic    Unable to Pay for Housing in the Last Year: No    Number of Times Moved in the Last Year: 0    Homeless in the Last Year: No  Utilities: Not At Risk (03/30/2024)   Epic    Threatened with loss of utilities: No  Health Literacy: Not on file    Family History  Problem Relation Age of Onset   Migraines Sister    Hypertension Other    Hyperlipidemia Other    Diabetes Other    Seizures Neg Hx    Stroke Neg Hx    Sleep apnea Neg Hx     Past Medical History:  Diagnosis Date   Diabetes mellitus without complication (HCC)    controlled with diet   History of kidney stones    Hyperlipidemia    Hypertension    Pancreatitis     Past Surgical History:  Procedure Laterality Date   EXTRACORPOREAL SHOCK WAVE LITHOTRIPSY Left 10/01/2018   Procedure: EXTRACORPOREAL SHOCK WAVE LITHOTRIPSY (ESWL);  Surgeon: Cam Morene ORN, MD;  Location: [facility];  Service: Urology;  Laterality: Left;   GANGLION CYST EXCISION     GRAFT APPLICATION     to bone right wrist times 2   ROTATOR CUFF REPAIR Right       Medications Ordered Prior to Encounter[1]  Allergies[2]   DIAGNOSTIC DATA (LABS, IMAGING, TESTING) - I reviewed patient records, labs,  notes, testing and imaging myself where available.  Lab Results  Component Value Date   WBC 5.5 04/01/2024   HGB 12.6 (L) 04/01/2024   HCT 34.3 (L) 04/01/2024   MCV 91.5 04/01/2024   PLT 198 04/01/2024      Component Value Date/Time   NA 135 07/23/2024 1259   K 4.7 07/23/2024 1259   CL 96 07/23/2024 1259   CO2 21 07/23/2024 1259   GLUCOSE 132 (H) 07/23/2024 1259   GLUCOSE 110 (H) 04/01/2024 0425   BUN 10 07/23/2024 1259   CREATININE 1.24 07/23/2024 1259   CREATININE 1.14 05/04/2021 1110   CALCIUM  10.6 (H) 07/23/2024 1259   PROT 8.6 (H) 03/29/2024 1710   ALBUMIN 4.5 03/29/2024 1710   AST 26 03/29/2024 1710   AST 238 (HH) 05/04/2021 1110   ALT 24 03/29/2024 1710   ALT 193 (H) 05/04/2021 1110   ALKPHOS 80 03/29/2024 1710   BILITOT 1.0 03/29/2024 1710   BILITOT 1.1 05/04/2021 1110   GFRNONAA >60 04/01/2024 0425   GFRNONAA >60 05/04/2021 1110   Lab Results  Component Value Date   CHOL 242 (H) 07/23/2024   HDL 64 07/23/2024   LDLCALC 149 (H) 07/23/2024   TRIG 161 (H) 07/23/2024   CHOLHDL 3.8 07/23/2024   Lab Results  Component Value Date   HGBA1C 5.8 (H) 04/01/2024   No results found for: VITAMINB12 Lab Results  Component Value Date   TSH 3.236 12/12/2023    PHYSICAL EXAM:  Vitals:   08/29/24 1544  BP: (!) 140/78  Pulse: 94  SpO2: 96%   No data found. Body mass index is 25.4 kg/m.   Wt Readings from Last 3 Encounters:  08/29/24 172 lb (78 kg)  07/23/24 170 lb (77.1 kg)  03/29/24 168 lb 14 oz (76.6 kg)     Ht Readings from Last 3 Encounters:  08/29/24 5' 9 (1.753 m)  07/23/24 5' 9 (1.753 m)  03/29/24 5' 9 (1.753 m)      General: The patient is awake, alert and appears not in acute distress and groomed. The patient is awake, alert and appears  in acute distress. The patient is well groomed. His face is  flushed.  Head: Normocephalic, atraumatic. Neck is supple.  Mallampati 3,  neck circumference:15 inches . Nasal airflow barely patent.   Retrognathia is not seen.  Dental status: biological  Cardiovascular:  Regular rate and cardiac rhythm by pulse,  without distended neck veins. Respiratory: Lungs are clear to auscultation.  Skin:  Without evidence of ankle edema, or rash. Clammy. Flushed face.  Trunk: The patient's posture is erect.   NEUROLOGIC EXAM: The patient is awake and alert, oriented to place and time.   Memory subjective described as intact.  Attention span & concentration ability appears normal.  Speech is fluent,  without  dysarthria, dysphonia or aphasia.  Mood and affect are appropriate.   Cranial nerves: no loss of smell or taste reported  Pupils are equal and briskly reactive to light. Funduscopic exam deferred. .  Extraocular movements in vertical and horizontal planes were intact and without nystagmus.  No Diplopia. Pain with extraocular movements. Visual fields by finger perimetry are intact. Hearing was intact to soft voice and finger rubbing.   Facial sensation intact to fine touch.  Facial motor strength is symmetric and tongue is tremulous.  Facial twitch left eye . Neck ROM : rotation, tilt and flexion extension were normal for age and shoulder shrug was symmetrical.    Motor exam:  Symmetric bulk, tone and ROM.   Normal tone without cog- wheeling, symmetric grip strength .   Sensory:  def. Coordination: Rapid alternating movements in the fingers/hands were of normal speed.  The Finger-to-nose maneuver was intact without evidence of ataxia, dysmetria but with resting and action tremor e-   ROMBERG positive , propulsive and retro-pulsive swaying. Tremor in face and hands increased,    Bilateral equal amplitude to the  tremor.   Gait and station: Patient could rise unassisted from a seated position, walked without assistive device.  Stance is of normal  width/ base and the patient turned with 4 steps.    Toe and heel walk were deferred.  Deep tendon reflexes: in the  upper and lower extremities are symmetric and intact.       ASSESSMENT AND PLAN :   59 y.o. year old male  here with: chronic migraine , > 15 months and no aura . no response to sumatriptan, no other migraine meds have been tried. ENT has seen him, a pulmonary cancer screen was done. Neurontin  prevention failed, beta blocker failed, sumatriptan.   He reports neither Ubrelvy  not Nurtec helped at all .  Chronic migraines, now reduced form > 15 / month to 4-5 / months and less long lasting.  Here is a sinus componenet and even a hemifacial pain component in the left face ;    1) headaches are still  migrainous, with nausea and photophobia.  valsalva increases headaches.  Nausea , dizziness - feeling slowed ,He has less of them and less intense since *-11-2023.   He takes Tylenol  for these.  sinus infections, concussions and of heavy smoking, some alcohol use   2) BP is the main culprit for lightheadedness, he has passed out several times (!) . He is not allowed to drive.    MRI reordered. Brain with and without, needing soon. Blood pressure may increase with headaches and also cause headaches.  3) Lightheadedness , spells with orthostatic changes. Has fainted .   No ankle edema, wears compression stockings. He hydrates well now.   4) snorer, needs a HST screening for apnea. Has known COPD, at risk for hypoxia.      I would like to thank Charles Anthony RAMAN, FNP and Charles Ivanoff, Md [address] [place],  [place] [address] for allowing me to meet with this pleasant patient.   Sleep Clinic Patients are generally offered input on sleep hygiene, life style changes and how to improve compliance with medical treatment where applicable. Review and reiteration of good sleep hygiene measures is offered to any sleep clinic patient, be it in the first consultation or  with any follow up visits.    Any patient with sleepiness should be cautioned not to drive, work at heights, or operate dangerous or heavy equipment when feeling tired or sleepy.      The patient will be seen in follow-up in the sleep clinic at [facility] for discussion of test results, sleep related symptoms and treatment compliance review, further management strategies, etc.   The referring provider will be notified of the test results.   The patient's condition requires frequent monitoring and adjustments in the treatment plan, reflecting the ongoing complexity of care.  This provider is the continuing focal point for all needed services for this condition.  After spending a total time of  39 minutes face to face and time for  history taking, physical and neurologic examination, review of laboratory studies,  personal  review of imaging studies, reports and results of other testing and review of referral information / records as far as provided in visit,   Electronically signed by: Dedra Gores, MD 08/29/2024 4:12 PM  [facility] Board certified by The [employer] of Sleep Medicine and Diplomate of the [employer] of Sleep Medicine. Board certified In Neurology through the ABPN, Fellow of the [employer] of Neurology.      [1]  Current Outpatient Medications on File Prior to Visit  Medication Sig Dispense Refill   acetaminophen  (TYLENOL ) 500 MG tablet Take 1,000 mg by mouth every 6 (six) hours as needed for mild pain (pain score 1-3) or headache.     albuterol  (VENTOLIN  HFA) 108 (90 Base) MCG/ACT inhaler Inhale 2 puffs into the lungs every 4 (four) hours as needed for wheezing or shortness of breath.     amLODipine  (NORVASC ) 10 MG tablet Take 1 tablet (10 mg total) by mouth daily. 90 tablet 3   aspirin  EC 81 MG tablet Take 1 tablet (81 mg total) by mouth daily. Swallow whole. 90 tablet 3   CREON  36000-114000 units CPEP capsule Take  36,000 Units by mouth 4 (four) times daily.     Cyanocobalamin  (VITAMIN B12) 1000 MCG TBCR Take 1,000 mcg by mouth daily.     gabapentin  (NEURONTIN ) 100 MG capsule Take 100 mg by mouth 3 (three) times daily as needed (Pain).     losartan  (COZAAR ) 100 MG tablet Take 1 tablet (100 mg total) by mouth daily. 90 tablet 3   meclizine  (ANTIVERT ) 25 MG tablet Take 25 mg by mouth 2 (two) times daily as needed for dizziness.     metoprolol  tartrate (LOPRESSOR ) 100 MG tablet Take 1 tablet 2 hours prior to cardiac ct 1 tablet 0   omeprazole (PRILOSEC) 20 MG capsule Take 20 mg by mouth daily as needed (Indigestion).     ondansetron  (ZOFRAN ) 8 MG tablet Take by mouth every 8 (eight) hours as needed for nausea or vomiting.     pantoprazole  (PROTONIX ) 40 MG tablet Take 40 mg by mouth daily.     rosuvastatin  (CRESTOR ) 40 MG tablet Take 1 tablet (40 mg total) by mouth daily. 90 tablet 3   SUMAtriptan (IMITREX) 50 MG tablet Take 50 mg by mouth every 2 (two) hours as needed for migraine.     Ubrogepant  (UBRELVY ) 100 MG TABS Take 1 tablet (100 mg total) by mouth daily as needed. 10 tablet 3   WIXELA INHUB 250-50 MCG/ACT AEPB Inhale 1 puff into the lungs 2 (two) times daily.     ALPRAZolam  (XANAX ) 0.5 MG tablet Take 1 tablet (0.5 mg total) by mouth at bedtime as needed (for MRI  tests , premedication). (Patient not taking: Reported on 08/29/2024) 5 tablet 0   Rimegepant Sulfate (NURTEC) 75 MG TBDP Take at onset of headache , can be taken every other day. (Patient not taking: Reported on 08/29/2024)     No current facility-administered medications on file prior to visit.  [2]  Allergies Allergen Reactions   Capsaicin Other (See Comments)    Other Reaction(s): burning   Choline Fenofibrate Nausea Only    Other Reaction(s): GI Upset   "

## 2024-08-29 NOTE — Patient Instructions (Signed)
 ASSESSMENT AND PLAN :    59 y.o. year old male  here with: chronic migraine , > 15 months and no aura . no response to sumatriptan, no other migraine meds have been tried. ENT has seen him, a pulmonary cancer screen was done. Neurontin  prevention failed, beta blocker failed, sumatriptan.   He reports neither Ubrelvy  not Nurtec helped at all .   Chronic migraines, now reduced form > 15 / month to 4-5 / months and less long lasting.      1) headaches are still  migrainous, with nausea and photophobia.  valsalva increases headaches.  Nausea , dizziness - feeling slowed ,He has less of them and less intense since *-11-2023.    He takes Tylenol  for these.  sinus infections, concussions and of heavy smoking, some alcohol use    2) BP is the main culprit for lightheadedness, he has passed out several times (!) . He is not allowed to drive.      MRI reordered. Brain with and without, needing soon. Blood pressure may increase with headaches and also cause headaches.   3) Lightheadedness , spells with orthostatic changes. Has fainted .   No ankle edema, wears compression stockings. He hydrates well now.    4) snorer, needs a HST screening for apnea. Has known COPD, at risk for hypoxia.       I would like to thank Dyane Anthony RAMAN, FNP and Douglass Ivanoff, Md [address] [place],  [place] [address] for allowing me to meet with this pleasant patient.

## 2024-09-03 ENCOUNTER — Ambulatory Visit (HOSPITAL_COMMUNITY)
Admission: RE | Admit: 2024-09-03 | Discharge: 2024-09-03 | Disposition: A | Discharge status: Home / Self Care | Source: Ambulatory Visit | Attending: Student in an Organized Health Care Education/Training Program | Admitting: Student in an Organized Health Care Education/Training Program

## 2024-09-03 DIAGNOSIS — R55 Syncope and collapse: Secondary | ICD-10-CM | POA: Diagnosis not present

## 2024-09-03 LAB — ECHOCARDIOGRAM COMPLETE
Area-P 1/2: 4.57 cm2
S' Lateral: 3 cm

## 2024-09-04 ENCOUNTER — Other Ambulatory Visit

## 2024-09-04 DIAGNOSIS — R0981 Nasal congestion: Secondary | ICD-10-CM

## 2024-09-04 DIAGNOSIS — F17200 Nicotine dependence, unspecified, uncomplicated: Secondary | ICD-10-CM | POA: Diagnosis not present

## 2024-09-04 DIAGNOSIS — G43701 Chronic migraine without aura, not intractable, with status migrainosus: Secondary | ICD-10-CM | POA: Diagnosis not present

## 2024-09-04 DIAGNOSIS — I158 Other secondary hypertension: Secondary | ICD-10-CM

## 2024-09-04 MED ORDER — GADOBENATE DIMEGLUMINE 529 MG/ML IV SOLN
20.0000 mL | Freq: Once | INTRAVENOUS | Status: AC | PRN
  Administered 2024-09-04: 20 mL via INTRAVENOUS

## 2024-09-06 ENCOUNTER — Ambulatory Visit
Attending: Student in an Organized Health Care Education/Training Program | Admitting: Student in an Organized Health Care Education/Training Program

## 2024-09-06 ENCOUNTER — Ambulatory Visit: Admitting: Neurology

## 2024-09-06 ENCOUNTER — Encounter: Payer: Self-pay | Admitting: Student in an Organized Health Care Education/Training Program

## 2024-09-06 VITALS — BP 130/80 | HR 93 | Ht 69.0 in | Wt 176.7 lb

## 2024-09-06 DIAGNOSIS — I951 Orthostatic hypotension: Secondary | ICD-10-CM

## 2024-09-06 DIAGNOSIS — R0600 Dyspnea, unspecified: Secondary | ICD-10-CM

## 2024-09-06 DIAGNOSIS — G43701 Chronic migraine without aura, not intractable, with status migrainosus: Secondary | ICD-10-CM

## 2024-09-06 DIAGNOSIS — Z9189 Other specified personal risk factors, not elsewhere classified: Secondary | ICD-10-CM

## 2024-09-06 DIAGNOSIS — R0981 Nasal congestion: Secondary | ICD-10-CM

## 2024-09-06 DIAGNOSIS — F17200 Nicotine dependence, unspecified, uncomplicated: Secondary | ICD-10-CM

## 2024-09-06 DIAGNOSIS — I158 Other secondary hypertension: Secondary | ICD-10-CM

## 2024-09-06 DIAGNOSIS — J441 Chronic obstructive pulmonary disease with (acute) exacerbation: Secondary | ICD-10-CM

## 2024-09-06 DIAGNOSIS — R0683 Snoring: Secondary | ICD-10-CM

## 2024-09-06 MED ORDER — ABDOMINAL BINDER/ELASTIC LARGE MISC: 0 refills | Status: AC

## 2024-09-06 NOTE — Patient Instructions (Signed)
 Medication Instructions:  START use of abdominal binder   *If you need a refill on your cardiac medications before your next appointment, please call your pharmacy*  Testing/Procedures: Pulmonary function test    Your physician has recommended that you have a pulmonary function test. Pulmonary Function Tests are a group of tests that measure how well air moves in and out of your lungs.   Follow-Up: At [facility], you and your health needs are our priority.  As part of our continuing mission to provide you with exceptional heart care, our providers are all part of one team.  This team includes your primary Cardiologist (physician) and Advanced Practice Providers or APPs (Physician Assistants and Nurse Practitioners) who all work together to provide you with the care you need, when you need it.  Your next appointment:   6 month(s)  Provider:   Georganna Archer, MD

## 2024-09-06 NOTE — Progress Notes (Signed)
 " Cardiology Office Note:  .   Date:  09/06/2024  ID:  Charles Wolf, DOB 01-Nov-1965, MRN [id number] PCP: Dyane Anthony RAMAN, FNP  [facility] Providers Cardiologist:  Georganna Archer, MD { Chief Complaint:  Chief Complaint  Patient presents with   Loss of Consciousness    History of Present Illness: .    Charles Wolf is a 59 y.o. male with a PMH of nonobstructive CAD with elevated CAC, HTN, HLD, DM2 c/b peripheral neuropathy, chronic pancreatitis, tobacco use disorder, migraines who presents for follow-up.  Discussed the use of AI scribe software for clinical note transcription with the patient, who gave verbal consent to proceed.  History of Present Illness Charles Wolf is a 59 year old male with coronary artery disease and diabetes who presents for follow-up  He experiences persistent lightheadedness, particularly when squatting, bending over, or looking up. Lightheadedness occurs upon standing, and he occasionally feels his equilibrium is off. He recalls one episode of syncope approximately six weeks ago. Modifying movements, such as getting up slowly, reduces the severity of symptoms, though lightheadedness persists.  He has a history of coronary artery disease with a calcium  score of 312, indicating non-obstructive coronary artery disease. He is currently on aspirin  and rosuvastatin . Previous cardiac evaluations included a coronary CT angiogram, echocardiogram, and heart monitor.  He has diabetes with neuropathy. He experiences rapid swings in blood pressure, with readings as low as 70/30 mmHg. His current medications include amlodipine  and losartan , both of which were recently doubled.  He reports shortness of breath. He has a history of smoking and has not had a pulmonary function test since he was 59 years old.  He has been using compression socks but did not notice any improvement in his symptoms. He has not experienced any swelling.  He is in contact with a  neurologist who has conducted an MRI, which was normal, and has ordered a sleep test. He is awaiting further follow-up.      Studies Reviewed: SABRA    EKG: No new ECG       Cardiac Studies & Procedures   ______________________________________________________________________________________________     ECHOCARDIOGRAM  ECHOCARDIOGRAM COMPLETE 09/03/2024  Narrative ECHOCARDIOGRAM REPORT    Patient Name:   Charles Wolf Date of Exam: 09/03/2024 Medical Rec #:  [id number]      Height:       69.0 in Accession #:    [id number]     Weight:       172.0 lb Date of Birth:  Jun 28, 1966       BSA:          1.938 m Patient Age:    58 years       BP:           168/100 mmHg Patient Gender: M              HR:           103 bpm. Exam Location:  [address]  Procedure: 2D Echo, Cardiac Doppler and Color Doppler (Both Spectral and Color Flow Doppler were utilized during procedure).  Indications:    R55 Syncope  History:        Patient has no prior history of Echocardiogram examinations. Signs/Symptoms:Syncope; Risk Factors:Hypertension, Diabetes, Dyslipidemia and Current Smoker.  Sonographer:    Charles Bohr RDCS Referring Phys: [id number] GEORGANNA ARCHER  IMPRESSIONS   1. Left ventricular ejection fraction, by estimation, is 60 to 65%. The left ventricle has normal function. The left ventricle has no  regional wall motion abnormalities. There is mild left ventricular hypertrophy. Left ventricular diastolic parameters were normal. 2. Right ventricular systolic function is normal. The right ventricular size is normal. 3. The mitral valve is normal in structure. No evidence of mitral valve regurgitation. No evidence of mitral stenosis. 4. The aortic valve is tricuspid. Aortic valve regurgitation is not visualized. No aortic stenosis is present. 5. The inferior vena cava is normal in size with greater than 50% respiratory variability, suggesting right atrial pressure of 3  mmHg.  FINDINGS Left Ventricle: Left ventricular ejection fraction, by estimation, is 60 to 65%. The left ventricle has normal function. The left ventricle has no regional wall motion abnormalities. The left ventricular internal cavity size was normal in size. There is mild left ventricular hypertrophy. Left ventricular diastolic parameters were normal.  Right Ventricle: The right ventricular size is normal. No increase in right ventricular wall thickness. Right ventricular systolic function is normal.  Left Atrium: Left atrial size was normal in size.  Right Atrium: Right atrial size was normal in size.  Pericardium: There is no evidence of pericardial effusion.  Mitral Valve: The mitral valve is normal in structure. No evidence of mitral valve regurgitation. No evidence of mitral valve stenosis.  Tricuspid Valve: The tricuspid valve is normal in structure. Tricuspid valve regurgitation is not demonstrated. No evidence of tricuspid stenosis.  Aortic Valve: The aortic valve is tricuspid. Aortic valve regurgitation is not visualized. No aortic stenosis is present.  Pulmonic Valve: The pulmonic valve was grossly normal. Pulmonic valve regurgitation is not visualized. No evidence of pulmonic stenosis.  Aorta: The aortic root and ascending aorta are structurally normal, with no evidence of dilitation.  Venous: The inferior vena cava is normal in size with greater than 50% respiratory variability, suggesting right atrial pressure of 3 mmHg.  IAS/Shunts: No atrial level shunt detected by color flow Doppler.   LEFT VENTRICLE PLAX 2D LVIDd:         4.30 cm   Diastology LVIDs:         3.00 cm   LV e' medial:    9.25 cm/s LV PW:         1.30 cm   LV E/e' medial:  8.7 LV IVS:        1.50 cm   LV e' lateral:   10.00 cm/s LVOT diam:     2.20 cm   LV E/e' lateral: 8.1 LV SV:         76 LV SV Index:   39 LVOT Area:     3.80 cm LV IVRT:       100 msec   RIGHT VENTRICLE             IVC RV  S prime:     20.00 cm/s  IVC diam: 0.80 cm TAPSE (M-mode): 2.4 cm PULMONARY VEINS Diastolic Velocity: 56.10 cm/s S/D Velocity:       1.40 Systolic Velocity:  79.80 cm/s  LEFT ATRIUM             Index        RIGHT ATRIUM           Index LA diam:        3.60 cm 1.86 cm/m   RA Pressure: 3.00 mmHg LA Vol (A2C):   51.1 ml 26.37 ml/m  RA Area:     11.60 cm LA Vol (A4C):   42.1 ml 21.73 ml/m  RA Volume:   26.40 ml  13.62 ml/m LA  Biplane Vol: 46.2 ml 23.84 ml/m AORTIC VALVE LVOT Vmax:   119.00 cm/s LVOT Vmean:  74.900 cm/s LVOT VTI:    0.200 m  AORTA Ao Root diam: 3.60 cm Ao Asc diam:  3.60 cm  MITRAL VALVE               TRICUSPID VALVE MV Area (PHT): 4.57 cm    Estimated RAP:  3.00 mmHg MV Decel Time: 166 msec MV E velocity: 80.70 cm/s  SHUNTS MV A velocity: 93.80 cm/s  Systemic VTI:  0.20 m MV E/A ratio:  0.86        Systemic Diam: 2.20 cm  Sunit Tolia Electronically signed by Madonna Large Signature Date/Time: 09/03/2024/3:46:20 PM    Final    MONITORS  LONG TERM MONITOR (3-14 DAYS) 08/19/2024  Narrative Details: Patch Wear Time:  14 days and 0 hours. Patient had a min HR of 61 bpm, max HR of 190 bpm, and avg HR of 92 bpm.  Predominant Rhythm: Normal Sinus  Arrhythmias: 1 run of non-sustained supraventricular tachycardia lasting for 4 beats at a rate of 190 bpm.  Ectopic Beats: There were rare premature atrial contractions (<1%) and rare premature ventricular contractions (<1%)  Patient Triggered Events: There were 1 patient triggered events. These events were associated with normal sinus rhythm.   Impressions: Unremarkable heart monitor results with predominant normal sinus rhythm. Ectopic beats were rare and within normal limits. One episode of PSVT that was not associated with symptoms.  Arlethia Basso T. Floretta HEATH, MD [place]  [facility] 08/22/2024 11:21 AM   CT SCANS  CT CORONARY MORPH W/CTA COR W/SCORE 08/12/2024  Addendum 09/06/2024 12:12  PM ADDENDUM REPORT: 09/06/2024 12:10  EXAM: OVER-READ INTERPRETATION  CT CHEST  The following report is an over-read performed by radiologist Dr. Andrea Gasman of [facility] on 09/06/2024. This over-read does not include interpretation of cardiac or coronary anatomy or pathology. The coronary CTA interpretation by the cardiologist is attached.  COMPARISON:  Chest CT 03/29/2024  FINDINGS: Vascular: No aortic atherosclerosis. The included aorta is normal in caliber.  Mediastinum/nodes: No adenopathy or mass.  Minimal hiatal hernia.  Lungs: Previous tree-in-bud in consolidative opacities in both lungs have resolved. No pulmonary nodule. No pleural fluid. The included airways are patent.  Upper abdomen: No acute findings. Diffusely decreased hepatic density typical of steatosis.  Musculoskeletal: There are no acute or suspicious osseous abnormalities.  IMPRESSION: 1. Hepatic steatosis. 2. Minimal hiatal hernia.   Electronically Signed By: Andrea Gasman M.D. On: 09/06/2024 12:10  Narrative CLINICAL DATA:  Chest pain  EXAM: Cardiac/Coronary CTA  TECHNIQUE: A non-contrast, gated CT scan was obtained with axial slices of 2.5 mm through the heart for calcium  scoring. Calcium  scoring was performed using the Agatston method. A 120 kV prospective, gated, contrast cardiac CT scan was obtained. Gantry rotation speed was 230 msec and collimation was 0.63 mm. Two sublingual nitroglycerin  tablets (0.8 mg) were given. The 3D data set was reconstructed with motion correction for the best systolic or diastolic phase. Images were analyzed on a dedicated workstation using MPR, MIP, and VRT modes. The patient received 95 cc of contrast.  FINDINGS: Image quality: Excellent.  Noise artifact is: Limited.  Coronary Arteries:  Normal coronary origin.  Right dominance.  Left main: The left main is a large caliber vessel with a normal take off from the left  coronary cusp that bifurcates to form a left anterior descending artery and a left circumflex artery. There is minimal  mixed density plaque (<25%).  Left anterior descending artery: The proximal LAD contains mild mixed density plaque (25-49%). The mid LAD contains moderate mixed density plaque (50-69%). The distal LAD is patent. The LAD gives off 2 patent diagonal branches.  Left circumflex artery: The LCX is non-dominant and patent with no evidence of plaque or stenosis. The LCX gives off 1 obtuse marginal branch with mild non-calcified plaque (25-49%).  Right coronary artery: The RCA is dominant with normal take off from the right coronary cusp. There is mild mixed density plaque in the proximal segment (25-49%). The mid and distal segments contain minimal mixed density plaque (<25%). The RCA terminates as a PDA and right posterolateral branch. The PDA is patent. The PLV contains minimal calcified plaque (<25%).  Right Atrium: Right atrial size is within normal limits.  Right Ventricle: The right ventricular cavity is within normal limits.  Left Atrium: Left atrial size is normal in size with no left atrial appendage filling defect. Small PFO.  Left Ventricle: The ventricular cavity size is within normal limits.  Pulmonary arteries: Normal in size.  Pulmonary veins: Normal pulmonary venous drainage.  Pericardium: Normal thickness without significant effusion or calcium  present.  Cardiac valves: The aortic valve is trileaflet without significant calcification. The mitral valve is normal without significant calcification.  Aorta: Normal caliber without significant disease.  Extra-cardiac findings: See attached radiology report for non-cardiac structures.  IMPRESSION: 1. Coronary calcium  score of 312. This was 88th percentile for age-, sex, and race-matched controls.  2. Normal coronary origin with right dominance.  3. Moderate mid LAD stenosis (50-69%).  4. Mild  non-calcified plaque in the OM1.  5. Mild mixed density plaque in the proximal RCA (25-49%).  6. Small PFO.  RECOMMENDATIONS: 1. CAD-RADS 3: Moderate stenosis. Consider symptom-guided anti-ischemic pharmacotherapy as well as risk factor modification per guideline directed care. Additional analysis with CT FFR will be submitted.  Darryle Decent, MD  Electronically Signed: By: Darryle Decent M.D. On: 08/13/2024 11:43     ______________________________________________________________________________________________      Results Radiology Coronary CT angiogram: Nonobstructive coronary artery disease Coronary artery calcium  score: 312 Brain MRI: Normal  Diagnostic Echocardiogram: Normal left ventricular systolic function; no significant valvular disease Ambulatory heart monitor: Unremarkable Overnight sleep study: Normal  Risk Assessment/Calculations:               Physical Exam:    VS:  BP 130/80 (BP Location: Left Arm, Patient Position: Sitting, Cuff Size: Normal)   Pulse 93   Ht 5' 9 (1.753 m)   Wt 176 lb 11.2 oz (80.2 kg)   SpO2 99%   BMI 26.09 kg/m      Wt Readings from Last 3 Encounters:  09/06/24 176 lb 11.2 oz (80.2 kg)  08/29/24 172 lb (78 kg)  07/23/24 170 lb (77.1 kg)     GEN: Well nourished, well developed, in no acute distress NECK: No JVD; No carotid bruits CARDIAC: RRR, no murmurs, rubs, gallops RESPIRATORY:  Clear to auscultation without rales, wheezing or rhonchi  ABDOMEN: Soft, non-tender, non-distended, normal bowel sounds EXTREMITIES:  Warm and well perfused, no edema; No deformity, 2+ radial pulses PSYCH: Normal mood and affect   ASSESSMENT AND PLAN: .    #Suspected Dysautonomia from DM2 #Syncope - Patient has ongoing positional lightheadedness with occasional syncope. - His cardiovascular evaluation thus far has been unremarkable including a CCTA which was negative for obstructive coronary disease, and normal echocardiogram, and a  heart monitor that showed no evidence of  arrhythmias. - I strongly suspect that the patient has dysautonomia related to his diabetes which is challenging to treat. - He has been wearing compression stockings without much effect. - I shared several options with the patient including conservative treatment versus starting medication such as Florinef. - Ultimately we both agreed to try an abdominal binder first to see if it provides some relief.  If not then I will consider starting Florinef on the patient's to see if that helps.  I would really want to avoid midodrine given that his blood pressures are now well-controlled and would cause it to spike. Prescribe abdominal binder to wear during daylight hours He can stop compression stockings and so not working Continue follow-up with neurology Can consider Florinef if abdominal binder is unsuccessful Continue increase p.o. water intake Follow-up in 6 months  #Non-Obstructive CAD #Elevated CAC Score Continue aspirin  81 mg daily  #HTN - His blood pressures look a lot better!  He shared with me that he still has some wide swings of blood pressure but the majority are in the 130-140s systolic. - Given that he is prone to labile blood pressures, I will keep his current blood pressure regiment as is. Continue amlodipine  10 mg daily Continue losartan  100 mg daily  #HLD - Due for lipid panel check. -Goal LDL <70. He had his lipids checked with his PCP.  He will share with me what his LDL was when it results. Continue rosuvastatin  40 mg daily  #Shortness of Breath #Tobacco Use Disorder - Ongoing shortness of breath that is not from a cardiac cause. - Given his longstanding tobacco use I query if this is COPD. - Will check PFTs. PFTs             This note was written with the assistance of a dictation microphone or AI dictation software. Please excuse any typos or grammatical errors.   Signed, Georganna Archer, MD  09/06/2024 3:49 PM     [facility] "

## 2024-09-09 ENCOUNTER — Ambulatory Visit: Payer: Self-pay | Admitting: Neurology

## 2024-09-11 NOTE — Telephone Encounter (Signed)
Per Dr Dohmeier 

## 2024-09-11 NOTE — Telephone Encounter (Signed)
-----   Message from Dedra Gores, MD sent at 09/09/2024 12:57 PM EST ----- GRE sequences remarkable for microhemorrhages, according to Dr. Rosemarie.  These do not explain headaches.  No scar formation or tumor seen.

## 2024-09-13 ENCOUNTER — Encounter (HOSPITAL_BASED_OUTPATIENT_CLINIC_OR_DEPARTMENT_OTHER): Payer: Self-pay | Admitting: Emergency Medicine

## 2024-09-13 ENCOUNTER — Emergency Department (HOSPITAL_BASED_OUTPATIENT_CLINIC_OR_DEPARTMENT_OTHER): Admission: EM | Admit: 2024-09-13 | Discharge: 2024-09-13 | Disposition: A | Discharge status: Home / Self Care

## 2024-09-13 DIAGNOSIS — Z7982 Long term (current) use of aspirin: Secondary | ICD-10-CM | POA: Insufficient documentation

## 2024-09-13 DIAGNOSIS — W228XXA Striking against or struck by other objects, initial encounter: Secondary | ICD-10-CM | POA: Diagnosis not present

## 2024-09-13 DIAGNOSIS — S0502XA Injury of conjunctiva and corneal abrasion without foreign body, left eye, initial encounter: Secondary | ICD-10-CM | POA: Insufficient documentation

## 2024-09-13 DIAGNOSIS — Y9389 Activity, other specified: Secondary | ICD-10-CM | POA: Diagnosis not present

## 2024-09-13 DIAGNOSIS — S0592XA Unspecified injury of left eye and orbit, initial encounter: Secondary | ICD-10-CM | POA: Diagnosis present

## 2024-09-13 MED ORDER — TETRACAINE HCL 0.5 % OP SOLN
2.0000 [drp] | Freq: Once | OPHTHALMIC | Status: AC
  Administered 2024-09-13: 2 [drp] via OPHTHALMIC
  Filled 2024-09-13: qty 4

## 2024-09-13 MED ORDER — FLUORESCEIN SODIUM 1 MG OP STRP
1.0000 | ORAL_STRIP | Freq: Once | OPHTHALMIC | Status: AC
  Administered 2024-09-13: 1 via OPHTHALMIC
  Filled 2024-09-13: qty 1

## 2024-09-13 NOTE — ED Notes (Signed)
 Reviewed discharge instructions and follow-up care with pt. Pt verbalized understanding and had no further questions. Pt exited ED without complications.

## 2024-09-13 NOTE — ED Notes (Signed)
 ED Provider at bedside.

## 2024-09-13 NOTE — ED Provider Notes (Signed)
 " Charles Wolf EMERGENCY DEPARTMENT AT [facility] Provider Note   CSN: [id number] Arrival date & time: 09/13/24  8069     Patient presents with: Eye Injury   Charles Wolf is a 59 y.o. male.    Eye Injury Pertinent negatives include no chest pain, no abdominal pain and no shortness of breath.  Presents with eye injury.  Patient states that he was using a screwdriver subsequently slipped and hit the left lateral aspect of his left eye.  Endorsing pain.  No vision changes.  No diplopia.  Denies all other complaints.  Does not wear contacts.  Wears glasses.   Previous medical history reviewed : Patient was last admitted in the hospital in August 2025 in the setting of left lower lobe pneumonia.      Prior to Admission medications  Medication Sig Start Date End Date Taking? Authorizing Provider  acetaminophen  (TYLENOL ) 500 MG tablet Take 1,000 mg by mouth every 6 (six) hours as needed for mild pain (pain score 1-3) or headache.    [provider]  albuterol  (VENTOLIN  HFA) 108 (90 Base) MCG/ACT inhaler Inhale 2 puffs into the lungs every 4 (four) hours as needed for wheezing or shortness of breath. Patient not taking: Reported on 09/06/2024 11/18/23   [provider]  ALPRAZolam  (XANAX ) 0.5 MG tablet Take 1 tablet (0.5 mg total) by mouth at bedtime as needed (for MRI  tests , premedication). Patient not taking: Reported on 08/29/2024 03/25/24   Dohmeier, Dedra, MD  amLODipine  (NORVASC ) 10 MG tablet Take 1 tablet (10 mg total) by mouth daily. 07/23/24   Floretta Mallard, MD  aspirin  EC 81 MG tablet Take 1 tablet (81 mg total) by mouth daily. Swallow whole. 08/19/24   Floretta Mallard, MD  CREON  [phone] units CPEP capsule Take 36,000 Units by mouth 4 (four) times daily. 08/25/23   [provider]  Cyanocobalamin  (VITAMIN B12) 1000 MCG TBCR Take 1,000 mcg by mouth daily. 03/11/22   [provider]  Elastic Bandages & Supports (ABDOMINAL  BINDER/ELASTIC LARGE) MISC Use as directed for orthostatic hypotension 09/06/24   Floretta Mallard, MD  fluticasone  [facility]) 50 MCG/ACT nasal spray Place 1 spray into both nostrils daily. 04/03/24   [provider]  gabapentin  (NEURONTIN ) 100 MG capsule Take 100 mg by mouth 3 (three) times daily as needed (Pain). 08/25/23   [provider]  levofloxacin (LEVAQUIN) 500 MG tablet Take 500 mg by mouth daily. 04/11/24   [provider]  losartan  (COZAAR ) 100 MG tablet Take 1 tablet (100 mg total) by mouth daily. 07/23/24 10/21/24  Floretta Mallard, MD  meclizine  (ANTIVERT ) 25 MG tablet Take 25 mg by mouth 2 (two) times daily as needed for dizziness. 11/14/23   [provider]  metoprolol  tartrate (LOPRESSOR ) 100 MG tablet Take 1 tablet 2 hours prior to cardiac ct 07/23/24   Floretta Mallard, MD  omeprazole (PRILOSEC) 20 MG capsule Take 20 mg by mouth daily as needed (Indigestion).    [provider]  ondansetron  (ZOFRAN ) 8 MG tablet Take by mouth every 8 (eight) hours as needed for nausea or vomiting. Patient not taking: Reported on 09/06/2024    [provider]  pantoprazole  (PROTONIX ) 40 MG tablet Take 40 mg by mouth daily. 11/22/23   [provider]  pravastatin  (PRAVACHOL ) 20 MG tablet Take 20 mg by mouth daily.    [provider]  Rimegepant Sulfate (NURTEC) 75 MG TBDP Take at onset of headache , can be taken every other day.  Patient not taking: Reported on 09/06/2024 03/25/24   Dohmeier, Dedra, MD  rosuvastatin  (CRESTOR ) 40 MG tablet Take 1 tablet (40 mg total) by mouth daily. 07/25/24 10/23/24  Floretta Mallard, MD  SUMAtriptan (IMITREX) 50 MG tablet Take 50 mg by mouth every 2 (two) hours as needed for migraine. Patient not taking: Reported on 09/06/2024 12/14/23   [provider]  Ubrogepant  (UBRELVY ) 100 MG TABS Take 1 tablet (100 mg total) by mouth daily as needed. Patient not taking: Reported on 09/06/2024 03/25/24   Dohmeier,  Dedra, MD  NAPOLEON INHUB 250-50 MCG/ACT AEPB Inhale 1 puff into the lungs 2 (two) times daily. 11/20/23   [provider]    Allergies: Capsaicin and Choline fenofibrate    Review of Systems  Constitutional:  Negative for chills and fever.  HENT:  Negative for ear pain and sore throat.   Eyes:  Negative for pain and visual disturbance.  Respiratory:  Negative for cough and shortness of breath.   Cardiovascular:  Negative for chest pain and palpitations.  Gastrointestinal:  Negative for abdominal pain and vomiting.  Genitourinary:  Negative for dysuria and hematuria.  Musculoskeletal:  Negative for arthralgias and back pain.  Skin:  Negative for color change and rash.  Neurological:  Negative for seizures and syncope.  All other systems reviewed and are negative.   Updated Vital Signs BP (!) 130/98 (BP Location: Right Arm)   Pulse 94   Temp 97.8 F (36.6 C)   Resp 18   SpO2 99%   Physical Exam Vitals and nursing note reviewed.  Constitutional:      General: He is not in acute distress.    Appearance: He is well-developed.  HENT:     Head: Normocephalic and atraumatic.  Eyes:     Conjunctiva/sclera: Conjunctivae normal.   Cardiovascular:     Rate and Rhythm: Normal rate and regular rhythm.     Heart sounds: No murmur heard. Pulmonary:     Effort: Pulmonary effort is normal. No respiratory distress.     Breath sounds: Normal breath sounds.  Abdominal:     Palpations: Abdomen is soft.     Tenderness: There is no abdominal tenderness.  Musculoskeletal:        General: No swelling.     Cervical back: Neck supple.  Skin:    General: Skin is warm and dry.     Capillary Refill: Capillary refill takes less than 2 seconds.  Neurological:     Mental Status: He is alert.  Psychiatric:        Mood and Affect: Mood normal.     (all labs ordered are listed, but only abnormal results are displayed) Labs Reviewed - No data to display  EKG: None  Radiology: No  results found.   Procedures   Medications Ordered in the ED  tetracaine  (PONTOCAINE) 0.5 % ophthalmic solution 2 drop (2 drops Left Eye Given by Other 09/13/24 2047)  fluorescein  ophthalmic strip 1 strip (1 strip Left Eye Given by Other 09/13/24 2047)                                    Medical Decision Making Risk Prescription drug management.     HPI:   Presents with eye injury.  Patient states that he was using a screwdriver subsequently slipped and hit the left lateral aspect of his left eye.  Endorsing pain.  No vision changes.  No diplopia.  Denies all other complaints.  Does not wear contacts.  Wears glasses.   Previous medical history reviewed : Patient was last admitted in the hospital in August 2025 in the setting of left lower lobe pneumonia. MDM:   Upon examination, patient hemodynamically stable. A&O x 3 with GCS 15.  Left eye examination: 20/70 vision eye pressure of 18, pupils equal reactive, irritation and redness left lateral aspect of the sclera.  No teardrop sign to be concerning for open globe.  Right eye: 20/70 vision.  Eye pressure of 19.  Consulted ophthalmology.  Dr. Evalene Raw  -he will see him in clinic tonight.  Patient was given clinic information.  Patient will head directly there to see the ophthalmologist.   Likely corneal abrasion.  Dr. Raw will handle the antibiotics for the patient.       Disposition and Follow Up: ophthalmology       Final diagnoses:  Abrasion of left cornea, initial encounter    ED Discharge Orders     None          Simon Lavonia SAILOR, MD 09/13/24 2111  "

## 2024-09-13 NOTE — ED Triage Notes (Signed)
 Left eye injury Screw driver slipped Happened around 6pm Blood in left eye

## 2024-09-13 NOTE — Discharge Instructions (Addendum)
 [address]  Dr. Lavonia  Cell [phone]   Go directly to his office tonight. He wants to see you there immediately. His Cell phone number is as above. Call when you get to his clinic and he will see you there.

## 2024-09-18 ENCOUNTER — Ambulatory Visit (HOSPITAL_COMMUNITY)
Admission: RE | Admit: 2024-09-18 | Discharge: 2024-09-18 | Disposition: A | Discharge status: Home / Self Care | Source: Ambulatory Visit | Attending: Student in an Organized Health Care Education/Training Program | Admitting: Student in an Organized Health Care Education/Training Program

## 2024-09-18 DIAGNOSIS — R0600 Dyspnea, unspecified: Secondary | ICD-10-CM | POA: Diagnosis present

## 2024-09-18 LAB — PULMONARY FUNCTION TEST
DL/VA % pred: 78 %
DL/VA: 3.36 ml/min/mmHg/L
DLCO unc % pred: 74 %
DLCO unc: 20.19 ml/min/mmHg
FEF 25-75 Post: 3.31 L/s
FEF 25-75 Pre: 2.52 L/s
FEF2575-%Change-Post: 31 %
FEF2575-%Pred-Post: 110 %
FEF2575-%Pred-Pre: 84 %
FEV1-%Change-Post: 5 %
FEV1-%Pred-Post: 84 %
FEV1-%Pred-Pre: 80 %
FEV1-Post: 3.01 L
FEV1-Pre: 2.85 L
FEV1FVC-%Change-Post: 4 %
FEV1FVC-%Pred-Pre: 101 %
FEV6-%Change-Post: 0 %
FEV6-%Pred-Post: 83 %
FEV6-%Pred-Pre: 83 %
FEV6-Post: 3.73 L
FEV6-Pre: 3.71 L
FEV6FVC-%Change-Post: 0 %
FEV6FVC-%Pred-Post: 104 %
FEV6FVC-%Pred-Pre: 104 %
FVC-%Change-Post: 1 %
FVC-%Pred-Post: 80 %
FVC-%Pred-Pre: 79 %
FVC-Post: 3.75 L
FVC-Pre: 3.71 L
Post FEV1/FVC ratio: 80 %
Post FEV6/FVC ratio: 99 %
Pre FEV1/FVC ratio: 77 %
Pre FEV6/FVC Ratio: 100 %
RV % pred: 111 %
RV: 2.4 L
TLC % pred: 94 %
TLC: 6.42 L

## 2024-09-18 MED ORDER — ALBUTEROL SULFATE (2.5 MG/3ML) 0.083% IN NEBU
2.5000 mg | INHALATION_SOLUTION | Freq: Once | RESPIRATORY_TRACT | Status: AC
  Administered 2024-09-18: 2.5 mg via RESPIRATORY_TRACT

## 2024-09-19 NOTE — Progress Notes (Unsigned)
 SABRA

## 2024-09-23 ENCOUNTER — Ambulatory Visit: Payer: Self-pay | Admitting: Student in an Organized Health Care Education/Training Program

## 2024-09-23 DIAGNOSIS — R942 Abnormal results of pulmonary function studies: Secondary | ICD-10-CM
